# Patient Record
Sex: Male | Born: 1935 | Race: White | Hispanic: No | Marital: Married | Smoking: Former smoker
Health system: Southern US, Community
[De-identification: ages and names within clinical notes are randomized; demographics above are authoritative.]

## PROBLEM LIST (undated history)

## (undated) DIAGNOSIS — M316 Other giant cell arteritis: Secondary | ICD-10-CM

## (undated) DIAGNOSIS — K922 Gastrointestinal hemorrhage, unspecified: Secondary | ICD-10-CM

## (undated) DIAGNOSIS — I779 Disorder of arteries and arterioles, unspecified: Secondary | ICD-10-CM

## (undated) DIAGNOSIS — I2510 Atherosclerotic heart disease of native coronary artery without angina pectoris: Secondary | ICD-10-CM

## (undated) DIAGNOSIS — I4891 Unspecified atrial fibrillation: Secondary | ICD-10-CM

## (undated) DIAGNOSIS — I4892 Unspecified atrial flutter: Secondary | ICD-10-CM

## (undated) DIAGNOSIS — M1990 Unspecified osteoarthritis, unspecified site: Secondary | ICD-10-CM

## (undated) DIAGNOSIS — I739 Peripheral vascular disease, unspecified: Secondary | ICD-10-CM

## (undated) DIAGNOSIS — N289 Disorder of kidney and ureter, unspecified: Secondary | ICD-10-CM

## (undated) DIAGNOSIS — K219 Gastro-esophageal reflux disease without esophagitis: Secondary | ICD-10-CM

## (undated) HISTORY — PX: ROTATOR CUFF REPAIR: SHX139

## (undated) HISTORY — DX: Disorder of kidney and ureter, unspecified: N28.9

## (undated) HISTORY — DX: Unspecified atrial fibrillation: I48.91

## (undated) HISTORY — PX: OTHER SURGICAL HISTORY: SHX169

## (undated) HISTORY — DX: Other giant cell arteritis: M31.6

## (undated) HISTORY — DX: Peripheral vascular disease, unspecified: I73.9

## (undated) HISTORY — DX: Gastro-esophageal reflux disease without esophagitis: K21.9

## (undated) HISTORY — DX: Unspecified osteoarthritis, unspecified site: M19.90

## (undated) HISTORY — DX: Unspecified atrial flutter: I48.92

## (undated) HISTORY — DX: Gastrointestinal hemorrhage, unspecified: K92.2

## (undated) HISTORY — DX: Atherosclerotic heart disease of native coronary artery without angina pectoris: I25.10

## (undated) HISTORY — DX: Disorder of arteries and arterioles, unspecified: I77.9

## (undated) HISTORY — PX: SHOULDER ARTHROSCOPY: SHX128

---

## 1992-06-27 HISTORY — PX: OTHER SURGICAL HISTORY: SHX169

## 1997-06-27 HISTORY — PX: HERNIA REPAIR: SHX51

## 2000-06-27 HISTORY — PX: OTHER SURGICAL HISTORY: SHX169

## 2004-07-07 ENCOUNTER — Ambulatory Visit: Payer: Self-pay | Admitting: Cardiology

## 2005-08-23 ENCOUNTER — Ambulatory Visit: Payer: Self-pay | Admitting: Cardiology

## 2006-10-27 ENCOUNTER — Encounter: Payer: Self-pay | Admitting: Cardiology

## 2006-10-31 HISTORY — PX: OTHER SURGICAL HISTORY: SHX169

## 2007-02-23 ENCOUNTER — Encounter: Payer: Self-pay | Admitting: Cardiology

## 2007-10-04 ENCOUNTER — Ambulatory Visit: Payer: Self-pay | Admitting: Cardiology

## 2007-10-30 ENCOUNTER — Encounter: Payer: Self-pay | Admitting: Cardiology

## 2007-10-31 ENCOUNTER — Encounter: Payer: Self-pay | Admitting: Cardiology

## 2007-11-01 ENCOUNTER — Encounter: Payer: Self-pay | Admitting: Cardiology

## 2008-07-02 ENCOUNTER — Encounter: Payer: Self-pay | Admitting: Cardiology

## 2008-08-01 ENCOUNTER — Ambulatory Visit: Payer: Self-pay | Admitting: Internal Medicine

## 2008-08-07 ENCOUNTER — Encounter: Payer: Self-pay | Admitting: Cardiology

## 2008-08-11 ENCOUNTER — Ambulatory Visit: Payer: Self-pay | Admitting: Cardiology

## 2008-08-11 DIAGNOSIS — I4891 Unspecified atrial fibrillation: Secondary | ICD-10-CM | POA: Insufficient documentation

## 2008-08-11 DIAGNOSIS — I10 Essential (primary) hypertension: Secondary | ICD-10-CM

## 2008-08-12 ENCOUNTER — Inpatient Hospital Stay (HOSPITAL_COMMUNITY): Admission: RE | Admit: 2008-08-12 | Discharge: 2008-08-16 | Payer: Self-pay | Admitting: Orthopaedic Surgery

## 2009-02-19 ENCOUNTER — Encounter (INDEPENDENT_AMBULATORY_CARE_PROVIDER_SITE_OTHER): Payer: Self-pay | Admitting: *Deleted

## 2009-02-26 ENCOUNTER — Ambulatory Visit: Payer: Self-pay | Admitting: Cardiology

## 2009-02-26 DIAGNOSIS — R0989 Other specified symptoms and signs involving the circulatory and respiratory systems: Secondary | ICD-10-CM

## 2009-02-26 DIAGNOSIS — R0609 Other forms of dyspnea: Secondary | ICD-10-CM

## 2009-09-01 ENCOUNTER — Encounter: Payer: Self-pay | Admitting: Cardiology

## 2009-09-17 ENCOUNTER — Encounter: Payer: Self-pay | Admitting: Cardiology

## 2009-09-22 ENCOUNTER — Encounter: Payer: Self-pay | Admitting: Cardiology

## 2009-10-07 ENCOUNTER — Encounter (INDEPENDENT_AMBULATORY_CARE_PROVIDER_SITE_OTHER): Payer: Self-pay | Admitting: *Deleted

## 2009-10-07 ENCOUNTER — Ambulatory Visit: Payer: Self-pay | Admitting: Cardiology

## 2009-10-07 DIAGNOSIS — R42 Dizziness and giddiness: Secondary | ICD-10-CM

## 2009-10-13 ENCOUNTER — Encounter: Payer: Self-pay | Admitting: Cardiology

## 2009-10-13 ENCOUNTER — Ambulatory Visit: Payer: Self-pay | Admitting: Cardiology

## 2009-10-20 ENCOUNTER — Encounter: Payer: Self-pay | Admitting: Cardiology

## 2009-11-27 ENCOUNTER — Encounter: Payer: Self-pay | Admitting: Cardiology

## 2009-11-28 ENCOUNTER — Encounter: Payer: Self-pay | Admitting: Cardiology

## 2009-11-30 ENCOUNTER — Encounter: Payer: Self-pay | Admitting: Cardiology

## 2009-12-02 ENCOUNTER — Telehealth (INDEPENDENT_AMBULATORY_CARE_PROVIDER_SITE_OTHER): Payer: Self-pay | Admitting: *Deleted

## 2010-01-18 ENCOUNTER — Ambulatory Visit: Payer: Self-pay | Admitting: Cardiology

## 2010-05-05 ENCOUNTER — Encounter: Payer: Self-pay | Admitting: Cardiology

## 2010-05-17 ENCOUNTER — Encounter: Payer: Self-pay | Admitting: Cardiology

## 2010-06-09 ENCOUNTER — Encounter: Payer: Self-pay | Admitting: Cardiology

## 2010-06-23 ENCOUNTER — Encounter: Payer: Self-pay | Admitting: Cardiology

## 2010-07-02 ENCOUNTER — Ambulatory Visit
Admission: RE | Admit: 2010-07-02 | Discharge: 2010-07-02 | Payer: Self-pay | Source: Home / Self Care | Attending: Cardiology | Admitting: Cardiology

## 2010-07-02 ENCOUNTER — Encounter: Payer: Self-pay | Admitting: Cardiology

## 2010-07-18 ENCOUNTER — Encounter: Payer: Self-pay | Admitting: Orthopaedic Surgery

## 2010-07-27 NOTE — Letter (Signed)
Summary: Lexiscan or Dobutamine Pharmacist, community at [facility]  [address]   [place], [place] [address]   Phone: [phone]  Fax: [phone]      [facility] Cardiovascular Services  Lexiscan or Dobutamine Cardiolite Strss Test    [address] [place]  Appointment Date:_  Appointment Time:_  Your doctor has ordered a CARDIOLITE STRESS TEST using a medication to stimulate exercise so that you will not have to walk on the treadmill to determine the condition of your heart during stress. If you take blood pressure medication, ask your doctor if you should take it the day of your test. You should not have anything to eat or drink at least 4 hours before your test is scheduled, and no caffeine, including decaffeinated tea and coffee, chocolate, and soft drinks for 24 hours before your test.  You will need to register at the Outpatient/Main Entrance at the hospital 15 minutes before your appointment time. It is a good idea to bring a copy of your order with you. They will direct you to the Diagnostic Imaging (Radiology) Department.  You will be asked to undress from the waist up and given a hospital gown to wear, so dress comfortably from the waist down for example: Sweat pants, shorts, or skirt Rubber soled lace up shoes (tennis shoes)  Plan on about three hours from registration to release from the hospital

## 2010-07-27 NOTE — Letter (Signed)
Summary: Letter/ SUR[facility]  Letter/ SURG[facility]   Imported By: Dorise Hiss 10/26/2009 11:29:10  _____________________________________________________________________  External Attachment:    Type:   Image     Comment:   External Document

## 2010-07-27 NOTE — Letter (Signed)
Summary: [facility] D/C DR. Beatrix Fetters [facility]  [facility] D/C DR. [name]   Imported By: Zachary George 01/18/2010 09:03:41  _____________________________________________________________________  External Attachment:    Type:   Image     Comment:   External Document

## 2010-07-27 NOTE — Progress Notes (Signed)
Summary: Medications  Phone Note Call from Patient   Caller: Daughter Summary of Call: states that the [place] took patient off of Terazosin/tg Initial call taken by: Raechel Ache [facility],  December 02, 2009 1:11 PM  Follow-up for Phone Call        Per Dr. [name], this is an Jonita Albee pt. and should be handled by the [facility] staff.  Follow-up by: Larita Fife Via LPN,  December 03, 4694 1:23 PM  Additional Follow-up for Phone Call Additional follow up Details #1::        Combined with previous note regarding medications. Additional Follow-up by: Cyril Loosen, RN, BSN,  December 02, 2009 4:53 PM

## 2010-07-27 NOTE — Assessment & Plan Note (Signed)
Summary: 3 month fu -recv reminder vs   Visit Type:  Follow-up Primary Provider:  Dr. [name]   History of Present Illness: 77 year old male presents for followup. Patient was referred for followup ischemic testing following his last visit, reviewed below. This was done as part of a preoperative assessment, although ultimately he never underwent right shoulder surgery.  I note that he was admitted to [facility] back in June following a fall. He ruled out for myocardial infarction and was treated for left-sided rib pain. He states he did not have chest pain or syncope, and that he tripped. He did undergo some medication adjustments.   He tells me that Dr. Sherryll Burger added lisinopril, although he complained of being dizzy on this medication and having relatively low blood pressures, studies stopped it. He was also seen at the [facility] clinic and his prostate medications were adjusted with concerns about relatively low blood pressure.   More recent blood pressure checks at home show systolics ranging in the 110-140 range, with heart rates in the 70s to 80s. He states he feels much better.  Preventive Screening-Counseling & Management  Alcohol-Tobacco     Smoking Status: quit     Year Quit: 1997  Current Medications (verified): 1)  Verapamil Hcl Cr 240 Mg Xr24h-Cap (Verapamil Hcl) .... Take One Capsule By Mouth Daily 2)  Multivitamins   Tabs (Multiple Vitamin) .... Once Daily 3)  Nitroglycerin 0.4 Mg Subl (Nitroglycerin) .... One Tablet Under Tongue Every 5 Minutes As Needed For Chest Pain---May Repeat Times Three 4)  Tylenol Extra Strength 500 Mg Tabs (Acetaminophen) .... As Needed 5)  Omeprazole 20 Mg Cpdr (Omeprazole) .... Take 1 Tablet By Mouth Once A Day 6)  Tamsulosin Hcl 0.4 Mg Caps (Tamsulosin Hcl) .... Take 1 Tablet By Mouth Once A Day  Allergies (verified): 1)  ! Asa 2)  ! Coumadin 3)  ! Plavix (Clopidogrel Bisulfate)  Comments:  Nurse/Medical Assistant: The patient's  medication list and allergies were reviewed with the patient and were updated in the Medication and Allergy Lists.  Past History:  Past Medical History: Last updated: 08/11/2008 Arthritis Atrial Fibrillation G I Bleed  Social History: Last updated: 08/11/2008 Retired  Tobacco Use - No.  Alcohol Use - no  Review of Systems  The patient denies anorexia, chest pain, syncope, dyspnea on exertion, peripheral edema, melena, and hematochezia.         Otherwise reviewed and negative.  Vital Signs:  Patient profile:   77 year old male Height:      71 inches Weight:      178 pounds BMI:     24.92 O2 Sat:      98 % on Room air Pulse rate:   61 / minute BP sitting:   136 / 77  (left arm) Cuff size:   regular  Vitals Entered By: Carlye Grippe (January 18, 2010 11:03 AM)  O2 Flow:  Room air  Physical Exam  Additional Exam:  Overweight male in no acute distress. HEENT: Conjunctiva and lids normal, oropharynx with moist mucosa. Neck: Supple, no elevated JVP or bruits. Lungs: Clear auscultation, nonlabored. Abdomen: Soft, nontender. Extremity: No pitting edema, distal pulses 1-2+. Skin: Warm and dry. Musculoskeletal: No gross deformities. He does have some limited movement of his right shoulder. Neuropsychiatric: Alert and oriented x3, affect appropriate.   Nuclear Study  Procedure date:  10/13/2009  Findings:      [employer]. No diagnostic ST segment changes. LVEF 62%. Medium, partially reversible, mid  to basal inferior defect suggesting ischemia, however in the setting of subdiaphragmatic uptake at rest.  Impression & Recommendations:  Problem # 1:  DIZZINESS (ICD-780.4)  This has improved significantly, possibly related to relative hypotension. He has undergone medication adjustments, now reflected in his list above. No changes were made today. I asked him to stay off the lisinopril.  Problem # 2:  ESSENTIAL HYPERTENSION, BENIGN (ICD-401.1)  Patient will  continue to check blood pressure at home. Most recent measurements are reasonable.  The following medications were removed from the medication list:    Terazosin Hcl 5 Mg Caps (Terazosin hcl) .Marland Kitchen... 2 at bedtime    Lisinopril 10 Mg Tabs (Lisinopril) .Marland Kitchen... Take 1 tablet by mouth once daily His updated medication list for this problem includes:    Verapamil Hcl Cr 240 Mg Xr24h-cap (Verapamil hcl) .Marland Kitchen... Take one capsule by mouth daily  Problem # 3:  ATRIAL FIBRILLATION (ICD-427.31)  Well-controlled symptomatically at this time on verapamil. Unable to use aspirin, Coumadin or Plavix related to previous adverse reactions and gastrointestinal hemorrhage.  Patient Instructions: 1)  Your physician wants you to follow-up in: 6 months. You will receive a reminder letter in the mail one-two months in advance. If you don't receive a letter, please call our office to schedule the follow-up appointment. 2)  Your physician recommends that you continue on your current medications as directed. Please refer to the Current Medication list given to you today.

## 2010-07-27 NOTE — Procedures (Signed)
Summary: Holter and Event/ [facility]  Holter and Event/ [facility]   Imported By: Dorise Hiss 10/23/2009 11:42:14  _____________________________________________________________________  External Attachment:    Type:   Image     Comment:   External Document  Appended Document: Holter and Event/ [facility] Reviewed.  No clear atrial fibrillation.

## 2010-07-27 NOTE — Letter (Signed)
Summary: External Correspondence/ OFFICE VISIT [facility]  External Correspondence/ OFFICE VISIT [facility]   Imported By: Dorise Hiss 10/23/2009 11:23:07  _____________________________________________________________________  External Attachment:    Type:   Image     Comment:   External Document

## 2010-07-27 NOTE — Letter (Signed)
Summary: External Correspondence/ OFFICE VISIT [facility]  External Correspondence/ OFFICE VISIT [facility]   Imported By: Dorise Hiss 10/23/2009 11:21:36  _____________________________________________________________________  External Attachment:    Type:   Image     Comment:   External Document

## 2010-07-27 NOTE — Progress Notes (Signed)
Summary: checking on new meds he was put on/tmj  Phone Note Call from Patient Call back at [phone]   Caller: PT Reason for Call: Talk to Nurse Summary of Call:  last time pt was seen by Dr [name] he was told he would have to be put on meds if her continued to have palpitations. Dr Clelia Croft has givin him a rx for Lisinopril 10 mg is this okay with Dr [name] for him take. Initial call taken by: Faythe Ghee,  December 02, 2009 9:13 AM  Follow-up for Phone Call        Pt. wants to be sure that Dr. [name] is aware that his PCP added Lisinoprl 10mg  by mouth once daily to his medications and wants to know if he agrees.  Follow-up by: Larita Fife Via LPN,  December 03, 979 9:58 AM  Additional Follow-up for Phone Call Additional follow up Details #1::        Lisinopril was likely added for HTN, not atrial fibrillation.  This is an [place] patient.  Please send this to Boneta Lucks to update record..  Further correspondence with patient should occur through the [place] office. Additional Follow-up by: Loreli Slot, MD, [facility],  December 02, 2009 1:15 PM    Additional Follow-up for Phone Call Additional follow up Details #2::    Caller: Daughter Summary of Call: states that the [place] took patient off of Terazosin/tg Initial call taken by: Raechel Ache [facility],  December 02, 2009 1:11 PM Follow-up for Phone Call         Per Dr. [name], this is an Jonita Albee pt. and should be handled by the [facility] staff.  Follow-up by: Larita Fife Via LPN,  December 02, 1912 1:23 PM Attempted to reach pt at number given in this message, but mailbox full, unable to leave message. Called home number in EMR and left message to call. Cyril Loosen, RN, BSN  December 02, 2009 4:52 PM  Spoke with pt who states Dr. Sherryll Burger prescribed Lisinopril 10mg  once daily and wants to know if this is okay for him to take. [facility] due to dizzy spells and low BP. This was replaced with tamsulosim b/c this would not effect his BP. Pt thinks he was placed  on Lisinopril for his heart being out of rhythm. He states this was started at [facility]. Records from [facility] will be pulled for review. Pt is aware Dr. [name] will review records from [facility] and this note and we will notify him further once this is reviewed. Follow-up by: Cyril Loosen, RN, BSN,  December 03, 2009 1:55 PM  Additional Follow-up for Phone Call Additional follow up Details #3:: Details for Additional Follow-up Action Taken: Pull records.  Lisinopril would not be used for atrial fibrillation. Additional Follow-up by: Loreli Slot, MD, [facility],  December 03, 2009 2:19 PM  New/Updated Medications: LISINOPRIL 10 MG TABS (LISINOPRIL) take 1 tablet by mouth once daily   Records from [facility] scanned into EMR. No d/c summary available at this time.  Cyril Loosen, RN, BSN  December 04, 2009 9:32 AM   Appended Document: checking on new meds he was put on/tmj Attempted to reach pt. No answer, mailbox has not been set up yet.  Appended Document: checking on new meds he was put on/tmj Left message to call back on machine.  Appended Document: checking on new meds he was put on/tmj Left messages for both the pt and  his dgt, [place], to call.  Appended Document: checking on new meds he was put on/tmj Pt's dgt notified to take meds as prescribed at D/C

## 2010-07-27 NOTE — Letter (Signed)
Summary: External Correspondence/ DR. Julian Reil  External Correspondence/ DR. Julian Reil   Imported By: Dorise Hiss 05/17/2010 15:57:05  _____________________________________________________________________  External Attachment:    Type:   Image     Comment:   External Document

## 2010-07-27 NOTE — Assessment & Plan Note (Signed)
Summary: 6 MO FU PER APRIL REMINDER-SRS   Preventive Screening-Counseling & Management  Alcohol-Tobacco     Smoking Status: quit     Year Quit: 1998  Current Medications (verified): 1)  Verapamil Hcl Cr 240 Mg Xr24h-Cap (Verapamil Hcl) .... Take One Capsule By Mouth Daily 2)  Multivitamins   Tabs (Multiple Vitamin) .... Once Daily 3)  Nitroglycerin 0.4 Mg Subl (Nitroglycerin) .... One Tablet Under Tongue Every 5 Minutes As Needed For Chest Pain---May Repeat Times Three 4)  Terazosin Hcl 5 Mg Caps (Terazosin Hcl) .... 2 At Bedtime 5)  Allegra 180 Mg Tabs (Fexofenadine Hcl) .... As Needed 6)  Tylenol Extra Strength 500 Mg Tabs (Acetaminophen) .... As Needed  Allergies (verified): 1)  ! Asa 2)  ! Coumadin 3)  ! Plavix (Clopidogrel Bisulfate)  Comments:  Nurse/Medical Assistant: The patient is currently on medications but does not know the name or dosage at this time. Instructed to contact our office with details. Will update medication list at that time. Patient stated all meds same since last visit.  Past History:  Past Medical History: Last updated: 08/11/2008 Arthritis Atrial Fibrillation G I Bleed  Social History: Last updated: 08/11/2008 Retired  Tobacco Use - No.  Alcohol Use - no  Visit Type:  Follow-up Primary Provider:  Dr. [name]   History of Present Illness: 77 year old male presents for a followup visit. He states he has been bothered by more frequent episodes of presumed recurrent atrial fibrillation. Symptomatically he feels dizzy and weak, sometimes with palpitations, although it is not clear that all these episodes are related to atrial fibrillation. He states that he had an episode like this while in his dermatologist's office, was sent to see Dr. Sherryll Burger the next day, and wore a Holter monitor for 24 hours. I do not have these results in hand, but the patient tells me that he only was found to have some occasional "skipped beats." He was also referred  for an echocardiogram done through [facility] internal medicine and revealed normal LVEF with LVH, mild left atrial enlargement, mild aortic valve sclerosis, and no significant mitral regurgitation. This episode occurred a few weeks ago, and he has had no recurrence. He is in normal sinus rhythm today.  In addition Mr. Brickle describes somewhat more noticeable shortness of breath with activity and occasional chest discomfort. His last ischemic evaluation was in 2003. He is also being considered for a right shoulder hemiarthroplasty under general anesthesia.     Physical Exam  Additional Exam:  Overweight male in no acute distress. HEENT: Conjunctiva and lids normal, oropharynx with moist mucosa. Neck: Supple, no elevated JVP or bruits. Lungs: Clear auscultation, nonlabored. Abdomen: Soft, nontender. Extremity: No pitting edema, distal pulses 1-2+. Skin: Warm and dry. Musculoskeletal: No gross deformities. He does have some limited movement of his right shoulder. Neuropsychiatric: Alert and oriented x3, affect appropriate.   Clinical Review Panels:  Nuclear Stress Testing Nuclear Stress Test Findings [place] [facility]:  No diagnostic ST segment changes during adenosine infusion. Rare premature ventricular complexes noted. Fixed apical defect as well as fixed inferior basal defect. Left ventricular ejection fraction of 60%. No clear evidence of ischemia. (10/03/2001)    Social History: Smoking Status:  quit  Review of Systems  The patient denies anorexia, fever, syncope, peripheral edema, prolonged cough, headaches, hemoptysis, melena, hematochezia, and severe indigestion/heartburn.         Otherwise reviewed and negative except as outlined.  Vital Signs:  Patient profile:   77 year old  male Pulse rate:   71 / minute BP sitting:   110 / 58  (left arm) Cuff size:   large     Impression & Recommendations:  Problem # 1:  ATRIAL FIBRILLATION (ICD-427.31)  Possibly with more  frequent episodes of recurrence, every 6-8 weeks. He has been experiencing episodic dizziness and weakness. I will request the results of the Holter monitor done through [facility] internal medicine recently. Otherwise reluctant to change any of his medications at this time without more objective data. His LVEF remains normal. He is on no anticoagulant or antiplatelet drugs and history of significant gastrointestinal hemorrhage. Can always consider potential antiarrhythmic therapy, although he will need follow up ischemic testing, particularly as he may be undergoing right shoulder surgery under general anesthesia. Is not clear that his recurrent atrial fibrillation would however preclude him proceeding with surgery, presuming his stress testing is low risk. Three-month visit will be scheduled, sooner if stress testing is significantly abnormal.  Orders: EKG w/ Interpretation (93000) Nuclear Med (Nuc Med)  Problem # 2:  DYSPNEA ON EXERTION (ICD-786.09)  As noted above, follow up ischemic testing will be obtained. We will order a Lexiscan Cardiolite on medical therapy.  His updated medication list for this problem includes:    Verapamil Hcl Cr 240 Mg Xr24h-cap (Verapamil hcl) .Marland Kitchen... Take one capsule by mouth daily   Patient Instructions: 1)  Lexiscan stress test 2)  Follow up in  3 months     EKG  Procedure date:  10/07/2009  Findings:      Sinus rhythm with prolonged PR interval of 270 ms, otherwise normal.  Echocardiogram  Procedure date:  09/22/2009  Findings:      [facility] internal medicine study:  Mild LVH with LVEF 70%, mild left atrial enlargement, normal aortic root, trace mitral and tricuspid regurgitation, mild aortic valve sclerosis, no pericardial effusion.

## 2010-07-29 NOTE — Assessment & Plan Note (Signed)
Summary: 6 MO FUL   Visit Type:  Follow-up Primary Provider:  Dr. [name]   History of Present Illness: 77 year old male presents for followup. He was seen back in July. Records indicate that he was seen by Dr. Julian Reil back in November following sudden vision loss in the left eye. He tells me that he was ruled out for having had a stroke, and ultimately diagnosed with temporal arteritis following a biopsy. He has been on prednisone since that time, and has recovered some vision.  No report of palpitations or unusual breathlessness. He has been compliant with his medications by report.  Preventive Screening-Counseling & Management  Alcohol-Tobacco     Smoking Status: quit     Year Quit: 1997  Current Medications (verified): 1)  Verapamil Hcl Cr 240 Mg Xr24h-Cap (Verapamil Hcl) .... Take One Capsule By Mouth Daily 2)  Multivitamins   Tabs (Multiple Vitamin) .... Once Daily 3)  Nitroglycerin 0.4 Mg Subl (Nitroglycerin) .... One Tablet Under Tongue Every 5 Minutes As Needed For Chest Pain---May Repeat Times Three 4)  Tylenol Extra Strength 500 Mg Tabs (Acetaminophen) .... As Needed 5)  Omeprazole 20 Mg Cpdr (Omeprazole) .... Take 1 Tablet By Mouth Once A Day 6)  Tamsulosin Hcl 0.4 Mg Caps (Tamsulosin Hcl) .... Take 1 Tablet By Mouth Once A Day 7)  Prednisone 10 Mg Tabs (Prednisone) .... Use As Directed  Allergies (verified): 1)  ! Asa 2)  ! Coumadin 3)  ! Plavix (Clopidogrel Bisulfate)  Comments:  Nurse/Medical Assistant: The patient's medication list and allergies were reviewed with the patient and were updated in the Medication and Allergy Lists.  Past History:  Social History: Last updated: 08/11/2008 Retired  Tobacco Use - No.  Alcohol Use - no  Past Medical History: Arthritis Atrial Fibrillation G I Bleed Temporal arteritis  Review of Systems  The patient denies anorexia, fever, weight loss, chest pain, syncope, dyspnea on exertion, peripheral edema, melena,  and hematochezia.         Otherwise reviewed and negative except as outlined.  Vital Signs:  Patient profile:   77 year old male Height:      71 inches Weight:      178 pounds Pulse rate:   72 / minute BP sitting:   146 / 78  (left arm) Cuff size:   regular  Vitals Entered By: Carlye Grippe (July 02, 2010 2:47 PM)  Physical Exam  Additional Exam:  Overweight male in no acute distress. HEENT: Conjunctiva and lids normal, oropharynx with moist mucosa. Neck: Supple, no elevated JVP or bruits. Lungs: Clear auscultation, nonlabored. Cardiac: Irregularly irregular, no S3. Abdomen: Soft, nontender. Extremity: No pitting edema, distal pulses 1-2+. Skin: Warm and dry. Musculoskeletal: No gross deformities. He does have some limited movement of his right shoulder. Neuropsychiatric: Alert and oriented x3, affect appropriate.   Carotid Doppler  Procedure date:  05/17/2010  Findings:      1-50% stenosis bilaterally within the internal carotid arteries.  EKG  Procedure date:  07/02/2010  Findings:      Atrial fibrillation at 77 beats per minute.  Impression & Recommendations:  Problem # 1:  ATRIAL FIBRILLATION (ICD-427.31)   Symptomatically stable on medical therapy. Continue present course. We have not been able to use antiplatelet or anticoagulant medications related to prior gastrointestinal hemorrhage. I will see him back in 6 months.  Orders: EKG w/ Interpretation (93000)  Patient Instructions: 1)  Your physician recommends that you schedule a follow-up appointment in: 6 months  2)  Your physician recommends that you continue on your current medications as directed. Please refer to the Current Medication list given to you today.

## 2010-10-12 LAB — HEMOGLOBIN AND HEMATOCRIT, BLOOD
HCT: 26.2 % — ABNORMAL LOW (ref 39.0–52.0)
HCT: 31.9 % — ABNORMAL LOW (ref 39.0–52.0)
Hemoglobin: 10.9 g/dL — ABNORMAL LOW (ref 13.0–17.0)
Hemoglobin: 8.8 g/dL — ABNORMAL LOW (ref 13.0–17.0)

## 2010-10-12 LAB — CBC
HCT: 27.1 % — ABNORMAL LOW (ref 39.0–52.0)
Hemoglobin: 10.4 g/dL — ABNORMAL LOW (ref 13.0–17.0)
MCV: 82.3 fL (ref 78.0–100.0)
Platelets: 246 10*3/uL (ref 150–400)
Platelets: 263 10*3/uL (ref 150–400)
Platelets: 359 10*3/uL (ref 150–400)
RBC: 3.12 MIL/uL — ABNORMAL LOW (ref 4.22–5.81)
RBC: 3.75 MIL/uL — ABNORMAL LOW (ref 4.22–5.81)
RDW: 16.3 % — ABNORMAL HIGH (ref 11.5–15.5)
RDW: 16.8 % — ABNORMAL HIGH (ref 11.5–15.5)
WBC: 12.9 10*3/uL — ABNORMAL HIGH (ref 4.0–10.5)
WBC: 7.6 10*3/uL (ref 4.0–10.5)
WBC: 7.9 10*3/uL (ref 4.0–10.5)

## 2010-10-12 LAB — CROSSMATCH: Antibody Screen: NEGATIVE

## 2010-10-12 LAB — URINALYSIS, ROUTINE W REFLEX MICROSCOPIC
Glucose, UA: NEGATIVE mg/dL
Glucose, UA: NEGATIVE mg/dL
Leukocytes, UA: NEGATIVE
Leukocytes, UA: NEGATIVE
Nitrite: NEGATIVE
Protein, ur: NEGATIVE mg/dL
Specific Gravity, Urine: 1.016 (ref 1.005–1.030)
Specific Gravity, Urine: 1.03 (ref 1.005–1.030)
Urobilinogen, UA: 0.2 mg/dL (ref 0.0–1.0)
pH: 5.5 (ref 5.0–8.0)

## 2010-10-12 LAB — DIFFERENTIAL
Basophils Relative: 0 % (ref 0–1)
Eosinophils Relative: 0 % (ref 0–5)
Monocytes Absolute: 1.2 10*3/uL — ABNORMAL HIGH (ref 0.1–1.0)
Monocytes Relative: 9 % (ref 3–12)
Neutro Abs: 10.1 10*3/uL — ABNORMAL HIGH (ref 1.7–7.7)

## 2010-10-12 LAB — COMPREHENSIVE METABOLIC PANEL
AST: 16 U/L (ref 0–37)
Albumin: 3.3 g/dL — ABNORMAL LOW (ref 3.5–5.2)
Alkaline Phosphatase: 59 U/L (ref 39–117)
BUN: 28 mg/dL — ABNORMAL HIGH (ref 6–23)
Chloride: 112 mEq/L (ref 96–112)
GFR calc Af Amer: >60 mL/min (ref >60)
Potassium: 4.1 mEq/L (ref 3.5–5.1)
Sodium: 140 mEq/L (ref 135–145)
Total Protein: 6.7 g/dL (ref 6.0–8.3)

## 2010-10-12 LAB — URINE MICROSCOPIC-ADD ON

## 2010-10-12 LAB — PREPARE RBC (CROSSMATCH)

## 2010-10-12 LAB — BASIC METABOLIC PANEL
BUN: 19 mg/dL (ref 6–23)
Calcium: 8.6 mg/dL (ref 8.4–10.5)
Chloride: 107 mEq/L (ref 96–112)
Creatinine, Ser: 1.11 mg/dL (ref 0.4–1.5)
Creatinine, Ser: 1.52 mg/dL — ABNORMAL HIGH (ref 0.4–1.5)
GFR calc Af Amer: 54 mL/min — ABNORMAL LOW (ref >60)
GFR calc Af Amer: >60 mL/min (ref >60)
GFR calc non Af Amer: 44 mL/min — ABNORMAL LOW (ref >60)
GFR calc non Af Amer: >60 mL/min (ref >60)

## 2010-10-12 LAB — APTT: aPTT: 35 seconds (ref 24–37)

## 2010-10-12 LAB — ABO/RH: ABO/RH(D): A POS

## 2010-10-12 LAB — URINE CULTURE

## 2010-11-09 NOTE — Consult Note (Signed)
Glenn Thornton, COCUZZA NO.:  [id number]   MEDICAL RECORD NO.:  [id number]          PATIENT TYPE:  INP   LOCATION:  4713                         FACILITY:  [facility]   PHYSICIAN:  Bevelyn Buckles. Bensimhon, MDDATE OF BIRTH:  11-05-1925   DATE OF CONSULTATION:  08/12/2008  DATE OF DISCHARGE:                                 CONSULTATION   PRIMARY CARDIOLOGIST:  Jonelle Sidle, MD, in our [place] office.   PRIMARY CARE [name]:  [name], MD   PATIENT PROFILE:  An 77 year old married Caucasian male with history of  paroxysmal atrial fibrillation, who presented today for left shoulder  hemiarthroplasty, who we were asked to evaluate.   PROBLEMS:  1. Paroxysmal atrial fibrillation.      a.     In 2008 2-D echocardiogram, EF 50-60% with aortic sclerosis.  2. Osteoarthritis of the bilateral shoulders.      a.     Status post surgery in the past bilaterally.      b.     Status post left hemiarthroplasty today.  3. Hypertension.  4. GERD.  5. Seasonal allergies.  6. History of GI bleeding when previously on Plavix as well as      Coumadin.   HISTORY OF PRESENT ILLNESS:  An 77 year old married Caucasian male with  history of PAF followed by Dr. [name] and managed with verapamil CR.  The patient has developed hives with aspirin in the past.  He has also  had GI bleeding when on Plavix and Coumadin (separately) in the past.  His CHADS2 score is 2 with a history of hypertension and age greater  than 90.  The patient also has a history of bilateral shoulder pain and  has had undergone surgeries in the past.  He has been having left  shoulder discomfort more recently and has been evaluated by Dr.  [name] with recommendation for left hemiarthroplasty.  Subsequently,  he saw Dr. [name] last week and he was felt to be at low risk for  surgery without any additional preoperative testing.  Surgery was  performed today and was uneventful.  The patient currently denies any  chest pain, palpitations, or dyspnea.  He was anemic postoperatively  with his H and H dropping at 8.8 and 26.2 from 10.2 and 30.1  respectively.  He remained in sinus rhythm and we were asked to evaluate  and follow throughout hospitalization.   ALLERGIES:  1. ASPIRIN causes hives.  2. COUMADIN causes GI bleed.  3. PLAVIX causes GI bleed.   HOME MEDICATIONS:  1. Verapamil SR 240 mg daily.  2. Multivitamin daily.  3. Prilosec 20 mg daily.  4. Terazosin 5 mg 2 tabs nightly.  5. Ferrous sulfate daily.  6. Allegra 180 mg p.r.n.   FAMILY HISTORY:  Mother died at 34 of diabetes and MI.  Father died at  83 of cerebral aneurysm.  He has 4 brothers, 2 sisters.  One brother  died of lung cancer, another brother died of diabetes and MI.  His other  2 brothers and 2 sisters are alive and well.   SOCIAL  HISTORY:  He lives in [place], [place], with his wife, who he  helps take care of; she has a history of COPD, MI, and obesity.  He is a  retired Medical illustrator.  He has a 50 pack-a-year history of tobacco use,  quitting about 15 years ago.  He used to occasionally have an alcoholic  beverage, but has not had anything in 15 years.  He denies any drug use.  He is not very active other than taking care of his wife.   REVIEW OF SYSTEMS:  Positive for left shoulder pain.  Otherwise, all  systems reviewed and are negative.  He is a full code.   PHYSICAL EXAMINATION:  VITAL SIGNS:  Temperature 97.8, heart rate 68,  respirations 18, blood pressure 127/68, and pulse ox 100% on room air.  GENERAL:  Pleasant white male in no acute distress, awake, alert, and  oriented x3.  HEENT:  Normal.  Nares grossly intact, nonfocal.  SKIN:  Warm and dry without lesions or masses.  PSYCH:  Normal affect.  MUSCULOSKELETAL:  He has a dressing to his left shoulder which is dry  and intact.  He does have some swelling over the left shoulder and he is  tender.  NECK:  No bruits or JVD.  LUNGS:  Respirations are  unlabored.  CARDIAC:  Regular S1 and S2.  No S3 or S4.  He has a soft systolic  murmur at the apex.  ABDOMEN:  Round, soft, nontender, and nondistended.  Bowel sounds  present x4.  EXTREMITIES:  Warm, dry, and pink.  No clubbing, cyanosis, or edema.  Dorsalis pedis, posterior tibial pulses are 2+ and equal bilaterally.   Chest x-ray from this morning shows COPD and emphysema.  EKG shows sinus  rhythm at a rate 87, normal axis, first-degree AV block, no acute ST-T  changes.  Hemoglobin is 8.8 and hematocrit 26.2, WBC 12.9, platelets  359.  Sodium 140, potassium 4.1, chloride 112, CO2 18, BUN 28,  creatinine 1.33, glucose 131, total bilirubin 0.5, alkaline phosphatase  59, AST 16, ALT 12, total protein 6.7, and albumin 3.3.  INR 1.0.  Calcium 9.0.  Urinalysis negative.   ASSESSMENT AND PLAN:  1. Paroxysmal atrial fibrillation.  The patient is currently in sinus      rhythm and we recommend continuation of his      long-acting verapamil.  He was not previously on anticoagulation      secondary to problems with anticoagulation in the past as outlined      above.  We will plan to follow along and I recommend calling us      with any acute issues.  2. Anemia, per Orthopedics.      Nicolasa Ducking, ANP      Bevelyn Buckles. Bensimhon, MD  Electronically Signed    CB/MEDQ  D:  08/12/2008  T:  08/13/2008  Job:  [id number]

## 2010-11-09 NOTE — Discharge Summary (Signed)
NAMEDIONISIOS, RICCI NO.:  [id number]   MEDICAL RECORD NO.:  [id number]          PATIENT TYPE:  INP   LOCATION:  4713                         FACILITY:  [facility]   PHYSICIAN:  Claude Manges. Whitfield, M.D.DATE OF BIRTH:  Dec 07, 1925   DATE OF ADMISSION:  08/12/2008  DATE OF DISCHARGE:  08/15/2008                               DISCHARGE SUMMARY   ADMISSION DIAGNOSIS:  Osteoarthritis left shoulder with rotator cuff  arthropathy.   DISCHARGE DIAGNOSES:  1. Osteoarthritis left shoulder with rotator cuff arthropathy.  2. Osteoarthritis right shoulder.  3. History of hypertension.  4. History of emphysema.  5. History prostate carcinoma.  6. History of myocardial infarction.  7. Chronic renal insufficiency.  8. Chronic anemia.  9. Urinary retention.  10.Acute blood loss anemia.  11.Second-degree heart block with history of paroxysmal atrial      fibrillation.  12.Gastroesophageal reflux disease.   PROCEDURE:  Left CTA of the shoulder.   HISTORY:  Mr. Ramus is an 77 year old white married male with constant  severe throbbing and aching pain of both shoulders, left greater than  right.  He is now having marked decreased range of motion and inability  to perform his activities of daily living.  He has failed conservative  treatment for this problem.  He has had apparently rotator cuff tear  with an arthropathy.  He is indicated now for left shoulder  hemiarthroplasty.   HOSPITAL COURSE:  An 77 year old male admitted August 12, 2008, and  after appropriate laboratory studies were obtained, he was then taken to  the operating room where he underwent a left CTA hemiarthroplasty of his  shoulder.  He tolerated the procedure well.  He was placed on a custom  Dilaudid PCA pump of loading dose of 0.2, dosing of 0.2, lock out 10  minutes and 5 mg 4-hour limit.  Given hydrocodone for postoperative  pain.  Consultations with OT and care management were made.  The patient  wanted to [facility] at time of discharge.  He was  placed on telemetry floor, and a consultation with [facility]  was ordered.  He was given 1 unit of packed cells on the 16th.  He was  allowed out of bed to chair the following day.  PT for gentle range of  motion, and circumduction exercise was ordered.  Verapamil SR 240 mg  p.o. daily was started on the 17th.  Cardiology p.r.n. orders for the  chart.  Also was started back on Hytrin 5 mg 2 p.o. every night.  The  patient was also given a GI cocktail on the 17th and was given  omeprazole 20 mg p.o. q.a.m.  His Foley had been discontinued, but  because of urinary retention, he was attempted to have a straight  catheter which was unable to be obtained, and a urology consult was  obtained, and a Foley was placed, and he was to keep this for 4 days.  On the 18th, he was also noted to be in a second degree heart block, and  cardiology was called.  No new orders  were given.  On the 19th, he was  given 1 unit of packed cells over 2 to 3 hours.  Cardiology was called  to evaluate the bradycardic episode, and at that time, they will  consider plans for discharge.   LABORATORY STUDIES:  Admitted with a hemoglobin of 10.2, hematocrit  30.1%, white count 12,900, platelets were 359,000.  On the 18th,  hemoglobin was 8.6, hematocrit 25.8%, white count 7600, platelets were  246,000.  Preoperative sodium was 140, potassium 4.1, chloride 112, CO2  18, glucose 131, BUN 28, creatinine 1.33.  GFR was 52.  Bilirubin 0.5,  alkaline phosphatase 59, SGOT 16, SGPT 12, total protein 6.7, albumin  3.3, and calcium 9.1.  On the 18th, sodium was 135, potassium 4.1,  chloride 107, CO2 22, glucose 132, BUN 19, creatinine 1.52.  GFR was 44,  and calcium was 8.3.  Urinalysis on the 16th revealed few squamous, rare  bacteria, and mucus was present.  Urine culture of February 16 revealed  no growth.  Urine from February 17 revealed moderate  amount of blood,  rare squamous, 0 to 2 whites, 11-20 reds, rare bacteria.   RADIOGRAPHIC STUDIES:  Chest x-ray February 16 revealed COPD and  emphysema.  EKG on August 11, 2008, revealed sinus rhythm with first-  degree AV block.  EKG of August 12, 2008, showed atrial fibrillation.  EKG of August 14, 2008, at 1800 hours revealed sinus rhythm with  second degree AV block, Mobitz type 1, septal infarct, age undetermined.  Heart rate at that time was 52.   DISCHARGE INSTRUCTIONS:  He is discharged on a heart healthy diet.  Keep  the incision clean and dry and do not remove the Steri-Strips.  Call if  any signs of infection.  He is to use a sling except for his exercises  which are circumduction with gentle range of motion.  He may shower, but  if possible, place saran wrap over the incision.  No lifting or driving  for 6 weeks.  He is to follow up with Dr. [name] on August 25, 2008.   MEDICATIONS:  Include:  1. Norco 5/325 one to two tablets q.4 h. p.r.n. pain.  2. Colace 100 mg b.i.d., hold for diarrhea.  3. Prilosec 20 mg at 12 p.m.  4. Verapamil 240 mg SR at 10:00 a.m.  5. Hytrin 10 mg h.s.  6. Allegra 180 mg daily p.r.n., then multivitamin daily.   Once he is cleared by cardiology, he may be discharged to [facility].  He was discharged in improved condition.  In addition, the Foley  which was placed will need to be removed on August 18, 2008, as per  urology orders.   </RADIOGRAPHIC      Oris Drone Petrarca, P.A.-C.      Claude Manges. [name], M.D.  Electronically Signed    BDP/MEDQ  D:  08/15/2008  T:  08/15/2008  Job:  [id number]

## 2010-11-09 NOTE — Op Note (Signed)
Glenn Thornton, Glenn Thornton NO.:  [id number]   MEDICAL RECORD NO.:  [id number]          PATIENT TYPE:  INP   LOCATION:  4713                         FACILITY:  [facility]   PHYSICIAN:  Claude Manges. Whitfield, M.D.DATE OF BIRTH:  July 12, 1925   DATE OF PROCEDURE:  08/12/2008  DATE OF DISCHARGE:                               OPERATIVE REPORT   PREOPERATIVE DIAGNOSIS:  Rotator cuff tear arthropathy, left shoulder.   POSTOPERATIVE DIAGNOSIS:  Rotator cuff tear arthropathy, left shoulder.   PROCEDURE:  cuff tear arthropathy and hemiarthroplasty, left shoulder  surgery.   SURGEON:  Claude Manges. [name], MD   ASSISTANT:  Lenard Galloway. Chaney Malling, MD   ANESTHESIA:  General.   COMPLICATIONS:  None.   COMPONENTS:  DePuy Global Advantage cuff tear arthropathy head, 48-mm  outer diameter with a 23 mm neck length.  A Global Advantage 14-mm  humeral stem, the stem was Press-Fit.   PROCEDURE:  With the patient comfortable on the operating table, the  patient was placed under general orotracheal anesthesia.  Nursing staff  inserted a Foley catheter, required a Coude catheter.  Urine was clear.   The patient was then placed in the semi-sitting position with the  shoulder frame.  The left shoulder was prepped with DuraPrep from the  base of the neck circumferentially to below the elbow.  Sterile draping  was performed.   A marking pen was used to outline a deltopectoral skin incision  beginning at the level of the coracoid, extending distally about 3  inches.  Via sharp dissection, incision was carried down to the  subcutaneous tissue.  Small bleeders were Bovie coagulated.  The  deltopectoral groove was identified.  It was carefully dissected  bluntly.  The cephalic vein was identified and retracted laterally.  Self-retaining retractor was inserted.  The clavipectoral fascia was  identified and was incised.  The conjoined tendon was taken medially and  the fascia was incised to the level of  the subscapularis.  The patient  did not have a rotational deformity preoperatively, but did have  limitation of abduction and flexion.   The subscapularis tendon was identified.  It was incised about a  centimeter from its attachment to the lesser tuberosity and tagged  superiorly and inferiorly.  It was taken as one layer with the capsule  and the subscap.  The joint was then entered.  There was abundant  synovial fluid that was removed.  There was abundant synovitis.  A  synovectomy and capsulectomy was then performed.  The humeral head  revealed multiple osteophytes posteriorly, inferiorly, and anteriorly.  The head was devoid of articular cartilage.  The glenoid appeared to be  its normal shape, but there was considerable loss of articular  cartilage.  Capsule did not appear to be particularly tight, but I did  release as best I could manually from circumferentially from the glenoid  with better visualization.  No further synovectomy was performed.   I could then easily deliver the head into the wound, but drill hole was  then made at the very center portion and reaming was performed  to 14 mm  where I had good endosteal purchase.  The medullary canal cutting guide  was then applied to the 14-mm reamer and with the appropriate  retroversion angle, osteotomy was performed with the head.  The cutting  jig and the reamer were then removed and rasping was performed to 14 mm.  We trialed a head and then reduced it and we thought we had perfect  position of the retroversion cut.   The trial head was then removed.  I elected to perform a CTA head  arthroplasty because the patient had a significantly thinned rotator  cuff and the CTA cutting guide was then applied and then further cutting  of the head superiorly was performed.  We did inspect the biceps tendon  and it was not in good position.  There was some fraying, but we left it  in place without need for a biceps tenodesis.   At  that point, the CTA head cutting jig was removed.  The trial CTA head  was applied.  We initially used a 48 mm outer diameter with a 15-mm neck  length.  It was then reduced and I felt we had a little bit too much  place, so we then inserted the neck length 23 mm.  At that point, we had  about 50% of posterior subluxation and I did not have the inferior  subluxation.  I could abduct probably 120 degrees where as  preoperatively was about 70-75 and I could flex about 130 degrees as was  preoperatively, it was fairly 90-100 degrees.  The trial device was  removed.  The joint was copiously irrigated with saline solution.  I  again inspected the joint and any further synovitis was then removed.  Further irrigation was performed with saline solution.   The final 14-mm Global Advantage humeral stem was then impacted.  We  cleaned the reverse Morse taper neck and then inserted the 48-mm outer  diameter with 23-mm neck length CTA head.  I impacted it, checked it,  and it was nice and tight.  I again irrigated the joint and reduced the  entire construct.  I thought had a very nice stability and excellent  position of the head.   The joint was again irrigated with saline solution.   The subscapularis was then reattached anatomically with the #1 Ethilon.  We had nice closure.  The cuff was thin as previously mentioned.  I did  repair any obvious tears.   The deltopectoral groove interval was closed with running 0 Vicryl.  Cephalic vein remained intact.  Subcu was closed with 2-0 Vicryl.  Skin  was closed with skin clips.  Xylocaine without epinephrine was injected  into the wound edges.  Sterile bulky dressing was applied followed by a  sling.   The patient tolerated the procedure without complications.      Claude Manges. [name], M.D.  Electronically Signed     PWW/MEDQ  D:  08/12/2008  T:  08/12/2008  Job:  [id number]

## 2010-11-09 NOTE — Assessment & Plan Note (Signed)
[facility]                          [facility]ICE NOTE   NAME:Radliff, EWALD BEG                       MRN:          [id number]  DATE:10/04/2007                            DOB:          08-20-1925    PRIMARY CARE PHYSICIAN:  [name], MD   REASON FOR VISIT:  Follow-up of paroxysmal atrial fibrillation.   HISTORY OF PRESENT ILLNESS:  Mr. Wiseman comes in for a 2-year visit.  He  has actually continued to do fairly well without any significant  palpitations and an electrocardiogram today shows sinus rhythm with a  prolonged PR interval of 262 milliseconds which is somewhat progressed  but not markedly so.  He has had no problems with dizziness or syncope.  He does have dyspnea on exertion at NYHA class II but no chest pain.  We  talked about a basic walking regimen today.  As mentioned previously, he  is not on Coumadin or aspirin given prior problems with significant  gastrointestinal hemorrhage.   ALLERGIES:  ASPIRIN (hives).  General intolerances to Coumadin and  Plavix due to recurrent gastrointestinal hemorrhages.   CURRENT MEDICATIONS:  1. Verapamil SR 240 mg p.o. daily.  2. Terazosin 5 mg p.o. daily.  3. Multivitamin one p.o. daily.  4. Prilosec 20 mg p.o. daily.  5. Sublingual nitroglycerin 0.4 mg p.r.n.   REVIEW OF SYSTEMS:  As described in the history of present illness.  Otherwise negative.   PHYSICAL EXAMINATION:  Blood pressure is 135/68, heart rate is 71,  weight is 185.6 pounds.  The patient is comfortable in no acute  distress.  HEENT:  Conjunctiva is normal. Pharynx clear.  NECK:  Supple.  No elevated jugular venous pressure, no loud bruits.  LUNGS:  Clear without labored breathing.  CARDIAC:  Reveals a regular rate and rhythm.  No loud murmur or gallop.  EXTREMITIES:  No significant pitting edema.   IMPRESSION/RECOMMENDATIONS:  1. History of paroxysmal atrial fibrillation, stable symptomatically      and in sinus  rhythm electrocardiogram today.  We will plan to      continue his verapamil. He is on neither aspirin or      Coumadin as detailed above.  2. Hypertension, followed by Dr. Clelia Croft. Will continue his regular      follow-up there.     Jonelle Sidle, MD  Electronically Signed    SGM/MedQ  DD: 10/04/2007  DT: 10/05/2007  Job #: [id number]   cc:   [name], MD

## 2010-11-09 NOTE — Assessment & Plan Note (Signed)
[facility]                          [facility]ICE NOTE   NAME:Glenn Thornton, Glenn Thornton                       MRN:          [id number]  DATE:08/11/2008                            DOB:          Oct 03, 1925    PRIMARY CARE PHYSICIAN:  [name], M.D.   ORTHOPEDIC SURGEON:  Claude Manges. [name], M.D.   REASON FOR VISIT:  Preoperative evaluation.   HISTORY OF PRESENT ILLNESS:  I saw Glenn Thornton in April 2009.  He has a  history of paroxysmal atrial fibrillation managed with long-acting  verapamil and symptomatically very well-controlled.  He is not reporting  any problems with chest pain, progressive shortness of breath, or  breakthrough palpitations.  In the past he has been intolerant to  ASPIRIN, COUMADIN and PLAVIX, with previous history of hives on ASPIRIN  and recurrent gastrointestinal hemorrhage on COUMADIN and PLAVIX.  His  electrocardiogram today shows sinus rhythm with a prolonged PR interval  of 220 msec, which is actually decreased compared to his previous  tracing (262 msec in April 2009).  He has had no problems with dizziness  or syncope.  Glenn Thornton is being considered for an elective left  shoulder joint replacement under general anesthesia due to progressive  arthritic discomfort.  This is to be performed tomorrow morning at [facility].   ALLERGIES:  Intolerances to ASPIRIN, COUMADIN and PLAVIX as outlined  above.   PRESENT MEDICATIONS INCLUDE:  1. Verapamil SR 240 mg p.o. daily.  2. Multivitamin 1 p.o. daily.  3. Prilosec 20 mg p.o. daily.  4. Terazosin 5 mg 2 tablets p.o. q.h.s.  5. Iron supplements daily.  6. Allegra 180 mg p.r.n.   REVIEW OF SYSTEMS:  As outlined above, otherwise negative.   PHYSICAL EXAMINATION:  Blood pressure today 115/60, heart rate 90 and  regular, weight is 175 pounds.  The patient is comfortable and in no  acute distress.  HEENT:  Conjunctivae are normal.  Pharynx clear.  NECK:  Supple.   No elevated jugular venous pressure, no loud bruits, no  thyromegaly is noted.  LUNGS:  Clear without wheezing or rales.  CARDIAC EXAM:  Reveals a regular rate and rhythm without murmur or  gallop.  ABDOMEN:  Soft, nontender.  Normoactive bowel sounds.  EXTREMITIES:  Exhibit no frank pitting edema.  His pulses are 1+.  SKIN:  Warm and dry.  MUSCULOSKELETAL:  No kyphosis noted.  NEUROPSYCHIATRIC:  The patient is alert and oriented x3.  Affect is  appropriate.   IMPRESSION AND RECOMMENDATIONS:  Preoperative evaluation in a pleasant  elderly gentleman with known history of paroxysmal atrial fibrillation,  although with very good symptom control on long-acting verapamil, and a  normal sinus rhythm today.  He has had intolerances to ASPIRIN, PLAVIX  and COUMADIN as detailed above, and is therefore on no specific  antiplatelet or anticoagulant regimen.  He has had no chest pain or  shortness of breath with no prior documented history of obstructive  coronary artery disease or myocardial infarction.  Previous assessment  of left ventricular systolic function via echocardiography in 2008  revealed an ejection fraction of 50-60% with aortic valve sclerosis and  no other major valvular abnormalities.  Clinically, Glenn Thornton has been  stable and I would anticipate that he should be able to proceed with  planned elective surgery with an acceptable perioperative cardiac risk.  If he does manifest any perioperative arrhythmias, our consultative  cardiology service can be contacted.  I would plan to continue his  present dose of verapamil SR, and we will see him back over the next 6  months otherwise if he does well.     Jonelle Sidle, MD  Electronically Signed    SGM/MedQ  DD: 08/11/2008  DT: 08/11/2008  Job #: [id number]   cc:   Claude Manges. [name], M.D.  [name], MD

## 2011-01-14 DIAGNOSIS — I4892 Unspecified atrial flutter: Secondary | ICD-10-CM

## 2011-01-14 DIAGNOSIS — I4891 Unspecified atrial fibrillation: Secondary | ICD-10-CM

## 2011-01-17 DIAGNOSIS — R079 Chest pain, unspecified: Secondary | ICD-10-CM

## 2011-02-24 ENCOUNTER — Encounter: Payer: Self-pay | Admitting: Cardiology

## 2011-02-25 ENCOUNTER — Encounter: Payer: Self-pay | Admitting: Cardiology

## 2011-02-25 ENCOUNTER — Ambulatory Visit (INDEPENDENT_AMBULATORY_CARE_PROVIDER_SITE_OTHER): Payer: Medicare Other | Admitting: Cardiology

## 2011-02-25 VITALS — BP 122/65 | HR 62 | Ht 71.0 in | Wt 173.0 lb

## 2011-02-25 DIAGNOSIS — I4892 Unspecified atrial flutter: Secondary | ICD-10-CM | POA: Insufficient documentation

## 2011-02-25 DIAGNOSIS — I10 Essential (primary) hypertension: Secondary | ICD-10-CM

## 2011-02-25 DIAGNOSIS — I2510 Atherosclerotic heart disease of native coronary artery without angina pectoris: Secondary | ICD-10-CM

## 2011-02-25 DIAGNOSIS — I4891 Unspecified atrial fibrillation: Secondary | ICD-10-CM

## 2011-02-25 NOTE — Patient Instructions (Signed)
Follow up as scheduled. Your physician recommends that you continue on your current medications as directed. Please refer to the Current Medication list given to you today. 

## 2011-02-25 NOTE — Assessment & Plan Note (Signed)
Known history, recent rhythm has been atrial flutter.

## 2011-02-25 NOTE — Assessment & Plan Note (Signed)
Based on noninvasive cardiac testing over the years. He states he had a cardiac catheterization many years ago in [place]. Plan to continue medical therapy. I suspect he has disease principally in the RCA distribution. He is not reporting any angina or nitroglycerin use.

## 2011-02-25 NOTE — Progress Notes (Signed)
Clinical Summary Glenn Thornton is a 77 y.o.male presenting for followup. He was seen in January in the office, more recently in consultation and more had back in July with atrial flutter on baseline history of atrial fibrillation. He ruled out for myocardial infarction, and underwent followup ischemic testing, with overall low risk Cardiolite, and LVEF of 50-55% by echocardiogram. He has been managed medically for underlying CAD, no active angina, with relatively low risk ischemic testing over the years.  Other testing including a VQ scan which was low probability for PE, findings of no significant peripheral arterial disease by Dopplers, chest x-ray consistent with COPD.  He was initiated on Plavix, however not fully anticoagulated with prior history of significant GI bleeding on Coumadin. He reports no significant melena or hematochezia at this point. ECG from hospital stay in July was reviewed showing atrial flutter with 4:1 block.  He is here with a nursing aid today. Reports no significant palpitations. Does get somewhat short of breath when he initially gets up to walk, however this resolves as he continues. No chest pain, no nitroglycerin glycerin use.  We reviewed his testing from recent hospitalization.   Allergies  Allergen Reactions  . Aspirin     REACTION: hives  . Warfarin Sodium     REACTION: gi problems    Medication list reviewed.  Past Medical History  Diagnosis Date  . GERD (gastroesophageal reflux disease)   . Arthritis   . Atrial fibrillation   . GI bleed   . Temporal arteritis   . Carotid artery disease   . Atrial flutter   . Coronary atherosclerosis of native coronary artery     Based on abnormal Cardiolite    Social History Glenn Thornton reports that he quit smoking about 16 years ago. His smoking use included Cigarettes. He has a 50 pack-year smoking history. He has never used smokeless tobacco. Glenn Thornton reports that he does not drink alcohol.  Review of  Systems As outlined above, otherwise negative.  Physical Examination Filed Vitals:   02/25/11 0948  BP: 122/65  Pulse: 62   Overweight male in no acute distress.  HEENT: Conjunctiva and lids normal, oropharynx with moist mucosa.  Neck: Supple, no elevated JVP or bruits.  Lungs: Clear auscultation, nonlabored.  Cardiac: Irregularly irregular, no S3.  Abdomen: Soft, nontender.  Extremity: No pitting edema, distal pulses 1-2+.  Skin: Warm and dry.  Musculoskeletal: No gross deformities. He does have some limited movement of his right shoulder.  Neuropsychiatric: Alert and oriented x3, affect appropriate.   ECG Refused.  Studies Lexiscan Cardiolite 10/13/2009: No diagnostic ST segment changes. LVEF 62%. Medium, partially reversible, mid to basal inferior defect suggesting ischemia, however in the setting of subdiaphragmatic uptake at rest.   Problem List and Plan

## 2011-02-25 NOTE — Assessment & Plan Note (Signed)
Diagnosed in July, rate controlled on medical therapy. He has a baseline history of atrial fibrillation, on Coumadin with history of recurrent GI bleeding. He was placed on Plavix during his hospitalization, tolerating it so far.

## 2011-02-25 NOTE — Assessment & Plan Note (Signed)
Continue medical therapy, sodium restriction.

## 2011-05-30 ENCOUNTER — Encounter: Payer: Self-pay | Admitting: Cardiology

## 2011-05-31 ENCOUNTER — Ambulatory Visit: Payer: Medicare Other | Admitting: Cardiology

## 2011-07-08 ENCOUNTER — Ambulatory Visit: Payer: Medicare Other | Admitting: Cardiology

## 2011-08-30 ENCOUNTER — Telehealth: Payer: Self-pay | Admitting: *Deleted

## 2011-08-30 NOTE — Telephone Encounter (Signed)
Pt will stand up and gets completely out of breath. They have seen primary MD 3 times and he can't seem to figure out what's going on.He also has decreased energy. Pt last seen 02/25/11. He was scheduled for office visits on 12/4 and 1/11, but both of these appts were cancelled due to transportation.  Pt's dgt states they have CNA's now to help get him to appts. Pt scheduled for first available with Dr. [name] on 10/04/11.

## 2011-09-01 DIAGNOSIS — R0602 Shortness of breath: Secondary | ICD-10-CM

## 2011-09-01 DIAGNOSIS — I4891 Unspecified atrial fibrillation: Secondary | ICD-10-CM

## 2011-09-02 DIAGNOSIS — I5031 Acute diastolic (congestive) heart failure: Secondary | ICD-10-CM

## 2011-09-20 ENCOUNTER — Other Ambulatory Visit: Payer: Self-pay

## 2011-10-04 ENCOUNTER — Ambulatory Visit: Payer: Medicare Other | Admitting: Cardiology

## 2011-11-16 ENCOUNTER — Ambulatory Visit (INDEPENDENT_AMBULATORY_CARE_PROVIDER_SITE_OTHER): Payer: Medicare Other | Admitting: Cardiology

## 2011-11-16 ENCOUNTER — Encounter: Payer: Self-pay | Admitting: Cardiology

## 2011-11-16 VITALS — BP 95/48 | HR 63 | Resp 20 | Ht 71.0 in | Wt 180.0 lb

## 2011-11-16 DIAGNOSIS — I4891 Unspecified atrial fibrillation: Secondary | ICD-10-CM

## 2011-11-16 DIAGNOSIS — I2510 Atherosclerotic heart disease of native coronary artery without angina pectoris: Secondary | ICD-10-CM

## 2011-11-16 DIAGNOSIS — I10 Essential (primary) hypertension: Secondary | ICD-10-CM

## 2011-11-16 DIAGNOSIS — D500 Iron deficiency anemia secondary to blood loss (chronic): Secondary | ICD-10-CM

## 2011-11-16 MED ORDER — NITROGLYCERIN 0.4 MG SL SUBL: 0.4000 mg | SUBLINGUAL_TABLET | SUBLINGUAL | Status: AC | PRN

## 2011-11-16 NOTE — Progress Notes (Signed)
Clinical Summary Glenn Thornton is a 78 y.o.male presenting for followup. He was seen in August 2012.  Record review finds hospital admission at [facility] back in March following a fall with right hip fracture, underwent placement of an intramedullary nail. It appears that he was taken off of Plavix by Dr. Sherryll Burger. Lab work from March showed BUN 47, creatinine 2.1, hemoglobin 11.4, platelets 244 Additional records from April indicate that the patient was subsequently noted to be severely anemic with hemoglobin down to 6.9, was hospitalized in [place] requiring red cell transfusion. He was reportedly Hemoccult negative.  He has an aspirin allergy, has not been back on Plavix, and was previously felt to be a poor anticoagulation candidate with history of GI bleed. We reviewed this issue again today, and have elected to keep him off all of these agents going forward.  He is doing rehabilitation at a nursing facility, plans to go home at the end of the week. Reports no angina, no sense of palpitations. Has had no falls.   Allergies  Allergen Reactions  . Aspirin     REACTION: hives  . Warfarin Sodium     REACTION: gi problems    Current Outpatient Prescriptions  Medication Sig Dispense Refill  . acetaminophen (TYLENOL) 500 MG tablet Take 500 mg by mouth as needed.        . ALPRAZolam (XANAX) 0.5 MG tablet Take 0.5 mg by mouth at bedtime as needed.      . bicalutamide (CASODEX) 50 MG tablet Take 50 mg by mouth daily.      Marland Kitchen docusate sodium (COLACE) 100 MG capsule Take 100 mg by mouth 2 (two) times daily.      Marland Kitchen escitalopram (LEXAPRO) 10 MG tablet Take 10 mg by mouth daily.      . ferrous sulfate 325 (65 FE) MG tablet Take 325 mg by mouth 3 (three) times daily with meals.      . finasteride (PROSCAR) 5 MG tablet Take 5 mg by mouth daily.      Marland Kitchen ipratropium-albuterol (DUONEB) 0.5-2.5 (3) MG/3ML SOLN Take 3 mLs by nebulization every 8 (eight) hours as needed.      . Magnesium Hydroxide (MILK OF  MAGNESIA PO) Take by mouth as needed.      . multivitamin (THERAGRAN) per tablet Take 1 tablet by mouth daily.        . ondansetron (ZOFRAN) 4 MG tablet Take 4 mg by mouth every 8 (eight) hours as needed.      . predniSONE (DELTASONE) 10 MG tablet Take 10 mg by mouth as directed.       . ranitidine (ZANTAC) 150 MG tablet Take 150 mg by mouth 2 (two) times daily.        . Tamsulosin HCl (FLOMAX) 0.4 MG CAPS Take 0.4 mg by mouth daily.        Marland Kitchen tiotropium (SPIRIVA) 18 MCG inhalation capsule Place 18 mcg into inhaler and inhale daily.      . verapamil (COVERA HS) 240 MG (CO) 24 hr tablet Take 240 mg by mouth daily.          Past Medical History  Diagnosis Date  . GERD (gastroesophageal reflux disease)   . Arthritis   . Atrial fibrillation   . GI bleed   . Temporal arteritis   . Carotid artery disease   . Atrial flutter   . Coronary atherosclerosis of native coronary artery     Based on abnormal Cardiolite    Social  History Glenn Thornton reports that he quit smoking about 17 years ago. His smoking use included Cigarettes. He has a 50 pack-year smoking history. He has never used smokeless tobacco. Glenn Thornton reports that he does not drink alcohol.  Review of Systems Stable appetite. No melena or hematochezia. No fevers or chills. Some hip pain. Able to ambulate, still uses wheelchair intermittently. Otherwise negative.  Physical Examination Filed Vitals:   11/16/11 1353  BP: 95/48  Pulse: 63  Resp: 20   Elderly male in no acute distress. Seated in wheelchair. HEENT: Conjunctiva and lids normal, oropharynx with moist mucosa.  Neck: Supple, no elevated JVP or bruits.  Lungs: Clear auscultation, nonlabored.  Cardiac: Regular, no S3.  Abdomen: Soft, nontender.  Extremity: No pitting edema, distal pulses 1-2+.  Skin: Warm and dry.  Musculoskeletal: No gross deformities.  Neuropsychiatric: Alert and oriented x3, affect appropriate.   ECG Underlying atrial fibrillation with  probable junctional rhythm, heart rate 60.  Problem List and Plan   ATRIAL FIBRILLATION Probable underlying atrial fibrillation with intermittent junctional rhythm. He has been clinically stable on verapamil for quite some time. No change made today. He denies any obvious syncope or lightheadedness. As noted previously, he is not a candidate for anticoagulation.  Coronary atherosclerosis of native coronary artery Managing medically at this point. No active angina. Refill given for p.r.n. Nitroglycerin.  ESSENTIAL HYPERTENSION, BENIGN Blood pressure on the low normal side today.  Blood loss anemia History of both GI bleeding and also post surgical bleeding, recently requiring packed red cell transfusions. As noted above, we plan to keep him off antiplatelet and anticoagulant medications going forward.     Jonelle Sidle, M.D., F.A.C.C.

## 2011-11-16 NOTE — Assessment & Plan Note (Signed)
Managing medically at this point. No active angina. Refill given for p.r.n. Nitroglycerin.

## 2011-11-16 NOTE — Assessment & Plan Note (Signed)
Probable underlying atrial fibrillation with intermittent junctional rhythm. He has been clinically stable on verapamil for quite some time. No change made today. He denies any obvious syncope or lightheadedness. As noted previously, he is not a candidate for anticoagulation.

## 2011-11-16 NOTE — Patient Instructions (Signed)
   Nitroglycerin refill sent to local pharmacy  Continue Verapamil as currently taking  No antiplatelet or anticoagulants with history of recurrent bleeding  Follow up in  3 months

## 2011-11-16 NOTE — Assessment & Plan Note (Signed)
Blood pressure on the low normal side today.

## 2011-11-16 NOTE — Assessment & Plan Note (Signed)
History of both GI bleeding and also post surgical bleeding, recently requiring packed red cell transfusions. As noted above, we plan to keep him off antiplatelet and anticoagulant medications going forward.

## 2012-02-20 ENCOUNTER — Encounter: Payer: Self-pay | Admitting: Cardiology

## 2012-02-20 ENCOUNTER — Ambulatory Visit (INDEPENDENT_AMBULATORY_CARE_PROVIDER_SITE_OTHER): Payer: Medicare Other | Admitting: Cardiology

## 2012-02-20 VITALS — BP 179/92 | HR 85 | Ht 71.0 in | Wt 178.8 lb

## 2012-02-20 DIAGNOSIS — Z79899 Other long term (current) drug therapy: Secondary | ICD-10-CM

## 2012-02-20 DIAGNOSIS — E785 Hyperlipidemia, unspecified: Secondary | ICD-10-CM

## 2012-02-20 DIAGNOSIS — I10 Essential (primary) hypertension: Secondary | ICD-10-CM

## 2012-02-20 DIAGNOSIS — I2510 Atherosclerotic heart disease of native coronary artery without angina pectoris: Secondary | ICD-10-CM

## 2012-02-20 DIAGNOSIS — I4891 Unspecified atrial fibrillation: Secondary | ICD-10-CM

## 2012-02-20 NOTE — Patient Instructions (Addendum)
Your physician recommends that you schedule a follow-up appointment in: 4 months.  Your physician recommends that you continue on your current medications as directed. Please refer to the Current Medication list given to you today.  Your physician recommends that you return for a FASTING lipid/liver profile: tomorrow 02/21/12 at [facility]. Please don't eat or drink after midnight tonight for your testing in the morning.

## 2012-02-20 NOTE — Assessment & Plan Note (Signed)
Blood pressure up significantly today. States he just took his medication within the hour. He does check blood pressure at home, reports systolics typically in the 120s to 130s. I have asked him to watch this closely.

## 2012-02-20 NOTE — Progress Notes (Signed)
Clinical Summary Mr. Muilenburg is a 78 y.o.male presenting for followup. He was seen in May. He reports single use of nitroglycerin since last visit. Tries to stay active, using a walker, remains at fairly high risk for falls. He denies any obvious bleeding or changes in his bowel pattern.  As noted previously, we are limited in medical therapy in terms of CAD and atrial fibrillation. He has history of falls and also GI bleeding. We have not had him on antiplatelet or anticoagulant medications recently.  He reports no palpitations. Has not had recent lipid panel. Not on statin therapy at this time.   Allergies  Allergen Reactions  . Aspirin     REACTION: hives  . Warfarin Sodium     REACTION: gi problems    Current Outpatient Prescriptions  Medication Sig Dispense Refill  . acetaminophen (TYLENOL) 500 MG tablet Take 500 mg by mouth as needed.        . ALPRAZolam (XANAX) 0.5 MG tablet Take 0.5 mg by mouth at bedtime as needed.      Marland Kitchen escitalopram (LEXAPRO) 10 MG tablet Take 10 mg by mouth daily.      Marland Kitchen ipratropium-albuterol (DUONEB) 0.5-2.5 (3) MG/3ML SOLN Take 3 mLs by nebulization every 8 (eight) hours as needed.      . multivitamin (THERAGRAN) per tablet Take 1 tablet by mouth daily.        . predniSONE (DELTASONE) 10 MG tablet Take 20 mg by mouth daily.       . ranitidine (ZANTAC) 150 MG tablet Take 150 mg by mouth 2 (two) times daily.        . Tamsulosin HCl (FLOMAX) 0.4 MG CAPS Take 0.4 mg by mouth daily.        . verapamil (COVERA HS) 240 MG (CO) 24 hr tablet Take 240 mg by mouth daily.        . nitroGLYCERIN (NITROSTAT) 0.4 MG SL tablet Place 1 tablet (0.4 mg total) under the tongue every 5 (five) minutes as needed. May repeat for up to 3 doses.  25 tablet  3    Past Medical History  Diagnosis Date  . GERD (gastroesophageal reflux disease)   . Arthritis   . Atrial fibrillation   . GI bleed   . Temporal arteritis   . Carotid artery disease   . Atrial flutter   . Coronary  atherosclerosis of native coronary artery     Based on abnormal Cardiolite    Social History Mr. Dillon reports that he quit smoking about 17 years ago. His smoking use included Cigarettes. He has a 50 pack-year smoking history. He has never used smokeless tobacco. Mr. Devine reports that he does not drink alcohol.  Review of Systems Reports stable appetite. No orthopnea or PND. No recent falls.Negative except as outlined above.  Physical Examination Filed Vitals:   02/20/12 1054  BP: 179/92  Pulse: 85    Elderly male in no acute distress. Uses walker. HEENT: Conjunctiva and lids normal, oropharynx with moist mucosa.  Neck: Supple, no elevated JVP or bruits.  Lungs: Clear auscultation, nonlabored.  Cardiac: Irregular, no S3. Soft systolic murmur. Abdomen: Soft, nontender.  Extremity: No pitting edema, distal pulses 1-2+.  Skin: Warm and dry.  Musculoskeletal: No gross deformities.     Problem List and Plan   ATRIAL FIBRILLATION Underlying atrial fibrillation with intermittent junctional rhythm. He has been clinically stable on verapamil for quite some time. As noted previously, he is not a candidate for anticoagulation.  Coronary atherosclerosis of native coronary artery Managing medically at this point. No active angina. At this point not on statin therapy, was otherwise limited medical treatment. Would like to obtain a lipid profile to see if he might benefit from further medical adjustment.  ESSENTIAL HYPERTENSION, BENIGN Blood pressure up significantly today. States he just took his medication within the hour. He does check blood pressure at home, reports systolics typically in the 120s to 130s. I have asked him to watch this closely.    Jonelle Sidle, M.D., F.A.C.C.

## 2012-02-20 NOTE — Assessment & Plan Note (Signed)
Underlying atrial fibrillation with intermittent junctional rhythm. He has been clinically stable on verapamil for quite some time. As noted previously, he is not a candidate for anticoagulation.

## 2012-02-20 NOTE — Assessment & Plan Note (Addendum)
Managing medically at this point. No active angina. At this point not on statin therapy, was otherwise limited medical treatment. Would like to obtain a lipid profile to see if he might benefit from further medical adjustment.

## 2012-02-22 ENCOUNTER — Telehealth: Payer: Self-pay | Admitting: *Deleted

## 2012-02-22 MED ORDER — ATORVASTATIN CALCIUM 20 MG PO TABS: 20.0000 mg | ORAL_TABLET | Freq: Every day | ORAL | Status: DC

## 2012-02-22 NOTE — Telephone Encounter (Signed)
Message copied by Eustace Moore on Wed Feb 22, 2012  4:41 PM ------      Message from: MCDOWELL, Illene Bolus      Created: Wed Feb 22, 2012  9:16 AM       Reviewed. Cholesterol 284 and LDL 187. Would suggest starting a statin if he is agreeable. Could try Lipitor 20 mg daily.

## 2012-02-22 NOTE — Telephone Encounter (Signed)
Patient informed. 

## 2012-11-05 ENCOUNTER — Telehealth: Payer: Self-pay | Admitting: *Deleted

## 2012-11-05 ENCOUNTER — Encounter: Payer: Self-pay | Admitting: Physician Assistant

## 2012-11-05 NOTE — Telephone Encounter (Signed)
Daughter called requesting an appointment for patient to be seen today for LE swelling for past 2 weeks. Daughter stated PCP saw patient and said he would prescribe lasix but prescription was never called in. Patient denies sob or chest pain. Daughter said patient does c/o of dizziness. Nurse advised daughter to have patient seen by PCP and first available appointment given to patient.

## 2012-11-07 ENCOUNTER — Encounter: Payer: Self-pay | Admitting: Physician Assistant

## 2012-11-09 ENCOUNTER — Encounter: Payer: Self-pay | Admitting: Physician Assistant

## 2012-11-09 ENCOUNTER — Ambulatory Visit (INDEPENDENT_AMBULATORY_CARE_PROVIDER_SITE_OTHER): Payer: Medicare Other | Admitting: Physician Assistant

## 2012-11-09 ENCOUNTER — Telehealth: Payer: Self-pay | Admitting: Physician Assistant

## 2012-11-09 VITALS — BP 129/57 | HR 53 | Ht 71.0 in | Wt 182.8 lb

## 2012-11-09 DIAGNOSIS — I739 Peripheral vascular disease, unspecified: Secondary | ICD-10-CM

## 2012-11-09 DIAGNOSIS — R600 Localized edema: Secondary | ICD-10-CM

## 2012-11-09 DIAGNOSIS — I10 Essential (primary) hypertension: Secondary | ICD-10-CM

## 2012-11-09 DIAGNOSIS — R0602 Shortness of breath: Secondary | ICD-10-CM

## 2012-11-09 DIAGNOSIS — N289 Disorder of kidney and ureter, unspecified: Secondary | ICD-10-CM

## 2012-11-09 DIAGNOSIS — R609 Edema, unspecified: Secondary | ICD-10-CM

## 2012-11-09 DIAGNOSIS — I2510 Atherosclerotic heart disease of native coronary artery without angina pectoris: Secondary | ICD-10-CM

## 2012-11-09 DIAGNOSIS — I4891 Unspecified atrial fibrillation: Secondary | ICD-10-CM

## 2012-11-09 MED ORDER — POTASSIUM CHLORIDE CRYS ER 20 MEQ PO TBCR: 20.0000 meq | EXTENDED_RELEASE_TABLET | Freq: Every day | ORAL | Status: AC

## 2012-11-09 NOTE — Assessment & Plan Note (Signed)
Quiescent on current medication regimen, absent ASA. Negative Lexiscan Cardiolite, 12/2010.

## 2012-11-09 NOTE — Assessment & Plan Note (Addendum)
Permanent AF with SVR, on same dose of verapamil. Continue watchful waiting for now, although we may need to decrease dose at time of next OV, if his HR remains persistently low. As previously documented, patient is not a candidate for anticoagulation, secondary to history of falls and remote GI bleed. He is also allergic to ASA. Would consider addition of Plavix in near future, particularly in light of recently documented PAD.

## 2012-11-09 NOTE — Assessment & Plan Note (Signed)
Stable by recent labs with creatinine 1.7. Will monitor closely with followup labs.

## 2012-11-09 NOTE — Progress Notes (Addendum)
Primary Cardiologist: Simona Huh, MD   HPI: Patient presents as an add-on for evaluation of peripheral edema, per Dr. Thomes Lolling request.   - Recent laboratory data, May 12: BUN 33, creatinine 1.7, potassium 3.7. BNP 460   - LE arterial Dopplers (ABI): 0.74 right; 1.1 left  Patient presents with recent onset bilateral peripheral edema, with no associated orthopnea, PND, or CP. He was recently started on Lasix 40 daily, but reports no significant improvement. He also had LE arterial Dopplers, with results as outlined above. Of note, he denies any intermittent claudication, although he is extremely limited in his mobility, essentially confined to a wheelchair.   12-lead EKG today by me, indicates atrial fibrillation 52 bpm; NSST changes  Allergies  Allergen Reactions  . Aspirin     REACTION: hives  . Warfarin Sodium     REACTION: gi problems    Current Outpatient Prescriptions  Medication Sig Dispense Refill  . ALPRAZolam (XANAX) 0.5 MG tablet Take 0.5 mg by mouth as needed.       . finasteride (PROSCAR) 5 MG tablet Take 5 mg by mouth at bedtime.      . furosemide (LASIX) 40 MG tablet Take 40 mg by mouth daily.      Marland Kitchen ipratropium-albuterol (DUONEB) 0.5-2.5 (3) MG/3ML SOLN Take 3 mLs by nebulization every 8 (eight) hours as needed.      . multivitamin (THERAGRAN) per tablet Take 1 tablet by mouth daily.        Marland Kitchen omeprazole (PRILOSEC) 20 MG capsule Take 20 mg by mouth daily.      . polyethylene glycol (MIRALAX / GLYCOLAX) packet Take 17 g by mouth as needed.      . predniSONE (DELTASONE) 10 MG tablet Take 10 mg by mouth 2 (two) times daily.       . Red Yeast Rice Extract (RED YEAST RICE PO) Take 2 capsules by mouth every morning.      . Tamsulosin HCl (FLOMAX) 0.4 MG CAPS Take 0.8 mg by mouth at bedtime.       . verapamil (COVERA HS) 240 MG (CO) 24 hr tablet Take 240 mg by mouth daily.        . nitroGLYCERIN (NITROSTAT) 0.4 MG SL tablet Place 1 tablet (0.4 mg total) under the tongue  every 5 (five) minutes as needed. May repeat for up to 3 doses.  25 tablet  3  . potassium chloride SA (K-DUR,KLOR-CON) 20 MEQ tablet Take 1 tablet (20 mEq total) by mouth daily.  30 tablet  6   No current facility-administered medications for this visit.    Past Medical History  Diagnosis Date  . GERD (gastroesophageal reflux disease)   . Arthritis   . Atrial fibrillation   . GI bleed   . Temporal arteritis   . Carotid artery disease   . Atrial flutter   . Coronary atherosclerosis of native coronary artery     Based on abnormal Cardiolite  . Renal insufficiency   . Peripheral arterial disease     ABIs: 0.74 right; 1.1 left, 10/2012    Past Surgical History  Procedure Laterality Date  . Mumford procedure, acromioplasty  10/31/2006  . Left shoulder surgery  2002  . Left knee surgery  1994  . Hernia repair  1999  . Cataract surgery    . Shoulder arthroscopy      Prior  . Rotator cuff repair      History   Social History  . Marital Status: Married  Spouse Name: N/A    Number of Children: N/A  . Years of Education: N/A   Occupational History  . RETIRED    Social History Main Topics  . Smoking status: Former Smoker -- 1.00 packs/day for 50 years    Types: Cigarettes    Quit date: 06/27/1994  . Smokeless tobacco: Never Used     Comment: Quit smoking approximately 15 years ago  . Alcohol Use: No     Comment: Quit Drinking in 1995  . Drug Use: No  . Sexually Active: Not on file   Other Topics Concern  . Not on file   Social History Narrative   Married Lives in [place], [place]     No family history on file.  ROS: no nausea, vomiting; no fever, chills; no melena, hematochezia; no claudication  PHYSICAL EXAM: BP 129/57  Pulse 53  Ht 5\' 11"  (1.803 m)  Wt 182 lb 12.8 oz (82.918 kg)  BMI 25.51 kg/m2  SpO2 98% GENERAL: 79 year-old male, chronically ill-appearing; NAD HEENT: NCAT, PERRLA, EOMI; sclera clear; no xanthelasma NECK: palpable bilateral carotid  pulses, no bruits; no JVD; no TM LUNGS: Faint, late crackles in left base, no wheezes CARDIAC: RRR (S1, S2); no significant murmurs; no rubs or gallops ABDOMEN: soft, non-tender; intact BS EXTREMETIES: 2+ bilateral peripheral edema (R > L), with 3+ edema; nonpalpable DPs; venous vericosity SKIN: warm/dry; no obvious rash/lesions MUSCULOSKELETAL: no joint deformity NEURO: no focal deficit; NL affect   EKG: reviewed and available in Electronic Records   ASSESSMENT & PLAN:  Peripheral edema Will order complete echocardiogram to assess RV function. Patient has history of NL LVF and RVF, by echocardiography in July 2012. There was also no suggestion of significant PHTN. If echocardiogram is negative for evidence of RV dysfunction, then this would suggest that his peripheral edema is due to to venous insufficiency. His exam is certainly suggestive of this. Additionally, we will increase Lasix to 40 twice a day (x1 week), then resume maintenance dose 40 daily. Will add supplemental potassium 20 daily. Will order CXR to rule out pulmonary edema. Will check followup metabolic profile and BNP level in 5 days, and arrange for early clinic followup.  ATRIAL FIBRILLATION Permanent AF with SVR, on same dose of verapamil. Continue watchful waiting for now, although we may need to decrease dose at time of next OV, if his HR remains persistently low. As previously documented, patient is not a candidate for anticoagulation, secondary to history of falls and remote GI bleed. He is also allergic to ASA. Would consider addition of Plavix in near future, particularly in light of recently documented PAD.  Coronary atherosclerosis of native coronary artery Quiescent on current medication regimen, absent ASA. Negative Lexiscan Cardiolite, 12/2010.  Peripheral arterial disease Results of recent ABIs were reviewed with patient and family. He currently denies any associated symptoms. Will defer referral to Dr. Kirke Corin for  further recommendations, at this point in time, but will revisit this in the near future.  Renal insufficiency Stable by recent labs with creatinine 1.7. Will monitor closely with followup labs.  ESSENTIAL HYPERTENSION, BENIGN Well-controlled on current medication regimen    Gene Emmaleah Meroney, PAC

## 2012-11-09 NOTE — Addendum Note (Signed)
Addended by: Lesle Chris on: 11/09/2012 04:24 PM   Modules accepted: Orders

## 2012-11-09 NOTE — Assessment & Plan Note (Signed)
Results of recent ABIs were reviewed with patient and family. He currently denies any associated symptoms. Will defer referral to Dr. Kirke Corin for further recommendations, at this point in time, but will revisit this in the near future.

## 2012-11-09 NOTE — Telephone Encounter (Signed)
Dr. Leary Roca office called stating Glenn Thornton is having congestion, bilateral foot swelling, Had labs and Vascular studies Done at [facility] this week. Studies have been scanned into Epic. Dr. Sharlot Gowda is requesting that we see Glenn Thornton asap. Please call his daughter with instructions.

## 2012-11-09 NOTE — Telephone Encounter (Signed)
Please add on this afternoon for either my schedule or Gene's schedule. Patient may have to wait, realizing that both schedules are full.

## 2012-11-09 NOTE — Assessment & Plan Note (Signed)
Well-controlled on current medication regimen 

## 2012-11-09 NOTE — Patient Instructions (Signed)
   Echo  Chest x-ray  Increase Lasix to 40mg  twice a day  X 1 week only, then resume previous dose  Begin K-Dur (potassium) daily Continue all other current medications. Follow up in  2 weeks

## 2012-11-09 NOTE — Assessment & Plan Note (Addendum)
Will order complete echocardiogram to assess RV function. Patient has history of NL LVF and RVF, by echocardiography in July 2012. There was also no suggestion of significant PHTN. If echocardiogram is negative for evidence of RV dysfunction, then this would suggest that his peripheral edema is due to to venous insufficiency. His exam is certainly suggestive of this. Additionally, we will increase Lasix to 40 twice a day (x1), then resume maintenance dose 40 daily. Will add supplemental potassium 20 daily. Will order CXR to rule out pulmonary edema. Will check followup metabolic profile and BNP level in 5 days, and arrange for early clinic followup.

## 2012-11-13 ENCOUNTER — Other Ambulatory Visit: Payer: Self-pay | Admitting: *Deleted

## 2012-11-13 DIAGNOSIS — I4891 Unspecified atrial fibrillation: Secondary | ICD-10-CM

## 2012-11-14 ENCOUNTER — Other Ambulatory Visit: Payer: Self-pay

## 2012-11-14 ENCOUNTER — Other Ambulatory Visit (INDEPENDENT_AMBULATORY_CARE_PROVIDER_SITE_OTHER): Payer: Medicare Other

## 2012-11-14 DIAGNOSIS — R609 Edema, unspecified: Secondary | ICD-10-CM

## 2012-11-21 ENCOUNTER — Telehealth: Payer: Self-pay | Admitting: *Deleted

## 2012-11-21 NOTE — Telephone Encounter (Signed)
Message copied by Eustace Moore on Wed Nov 21, 2012  9:27 AM ------      Message from: MCDOWELL, Illene Bolus      Created: Thu Nov 15, 2012 10:59 AM       LVEF remains normal, possible area of hypokinesis in the distal inferior wall (known CAD). Diastolic assessment was not complete. In terms of valvular status, no aortic stenosis, only mild mitral regurgitation, trivial tricuspid regurgitation. Normal RV function. ------

## 2012-11-21 NOTE — Telephone Encounter (Signed)
Patient informed. 

## 2012-11-22 ENCOUNTER — Ambulatory Visit: Payer: Medicare Other | Admitting: Physician Assistant

## 2012-11-22 ENCOUNTER — Ambulatory Visit (INDEPENDENT_AMBULATORY_CARE_PROVIDER_SITE_OTHER): Payer: Medicare Other | Admitting: Cardiology

## 2012-11-22 ENCOUNTER — Encounter: Payer: Self-pay | Admitting: Cardiology

## 2012-11-22 VITALS — BP 190/74 | HR 79 | Ht 71.0 in | Wt 184.0 lb

## 2012-11-22 DIAGNOSIS — R809 Proteinuria, unspecified: Secondary | ICD-10-CM

## 2012-11-22 DIAGNOSIS — M79605 Pain in left leg: Secondary | ICD-10-CM

## 2012-11-22 DIAGNOSIS — N289 Disorder of kidney and ureter, unspecified: Secondary | ICD-10-CM

## 2012-11-22 DIAGNOSIS — R609 Edema, unspecified: Secondary | ICD-10-CM

## 2012-11-22 DIAGNOSIS — I739 Peripheral vascular disease, unspecified: Secondary | ICD-10-CM

## 2012-11-22 DIAGNOSIS — I4891 Unspecified atrial fibrillation: Secondary | ICD-10-CM

## 2012-11-22 DIAGNOSIS — D500 Iron deficiency anemia secondary to blood loss (chronic): Secondary | ICD-10-CM

## 2012-11-22 DIAGNOSIS — I10 Essential (primary) hypertension: Secondary | ICD-10-CM

## 2012-11-22 DIAGNOSIS — M79609 Pain in unspecified limb: Secondary | ICD-10-CM

## 2012-11-22 DIAGNOSIS — I2510 Atherosclerotic heart disease of native coronary artery without angina pectoris: Secondary | ICD-10-CM

## 2012-11-22 NOTE — Assessment & Plan Note (Signed)
Blood pressure seems to be up and down based on home measurements versus office measurements. I expect that he will need an additional agent, states that he is compliant with verapamil. Somewhat reluctant to start anything new now until more information is available pending further testing. We will obtain renal artery duplex studies as well prior to his visit with Dr. Kirke Corin.

## 2012-11-22 NOTE — Assessment & Plan Note (Signed)
Continue strategy of heart rate control, prior documentation of GI bleeding and poor candidacy for anticoagulation. He has not even been able to stay on aspirin consistently.

## 2012-11-22 NOTE — Assessment & Plan Note (Signed)
Diagnosis based on previous noninvasive imaging, managed conservatively. No active angina symptoms. Recent LVEF documented at 55-60% with distal inferior hypokinesis.

## 2012-11-22 NOTE — Progress Notes (Signed)
Clinical Summary Glenn Thornton is an 79 y.o.male presenting for a followup visit, recently seen by Mr. Serpe PA-C on 5/16. He had been referred back to the office at that time secondary to edema, also abnormal peripheral arterial studies consistent with PAD, particularly on the right side with an ABI of 0.74.  At the last visit Lasix was intensified to 40 mg twice daily for one week then back to maintenance dose at once daily with appropriate potassium supplementation. Followup testing included chest x-ray demonstrating thoracic vertebral compression likely osteoporotic with low bone density, no focal infiltrates, no effusions, stable cardiomegaly and probable COPD. Echocardiogram demonstrated LVEF 55-60% with distal inferior hypokinesis, mildly thickened aortic valve with reduced non-coronary cusp excursion - no definite stenosis, mild mitral regurgitation, moderate left atrial enlargement, no pericardial effusion.   Lab work from 5/21 showed BUN 34, creatinine 1.6 (stable), GFR 41, potassium 4.2, BNP 540. His weight has gone up just over a pound since last visit. He has actually continued the Lasix at twice daily, has not experienced any improvement in his edema which is mainly centered around his ankles and dorsum of his feet, left worse than right.  Actually the symptoms seem to be progressing. He does report some tingling and pain in his legs, is not all that active, uses a wheelchair. Not entirely clear if he is experiencing any frank claudication. He has not had more extensive evaluation for DVT, proteinuria, or reassessment of thyroid status. He does have a significant problem with his urine stream, states that he has very little urine output when he urinates, but goes frequently. He is followed by urologist and is on Flomax. He has been on steroids long-term for temporal arteritis.   Allergies  Allergen Reactions  . Aspirin     REACTION: hives  . Warfarin Sodium     REACTION: gi problems  .  Hydrocodone Other (See Comments)    hallucinations    Current Outpatient Prescriptions  Medication Sig Dispense Refill  . ALPRAZolam (XANAX) 0.5 MG tablet Take 0.5 mg by mouth as needed.       . finasteride (PROSCAR) 5 MG tablet Take 5 mg by mouth at bedtime.      . furosemide (LASIX) 40 MG tablet Take 40 mg by mouth 2 (two) times daily.       Marland Kitchen ipratropium-albuterol (DUONEB) 0.5-2.5 (3) MG/3ML SOLN Take 3 mLs by nebulization every 8 (eight) hours as needed.      . multivitamin (THERAGRAN) per tablet Take 1 tablet by mouth daily.        . nitroGLYCERIN (NITROSTAT) 0.4 MG SL tablet Place 1 tablet (0.4 mg total) under the tongue every 5 (five) minutes as needed. May repeat for up to 3 doses.  25 tablet  3  . omeprazole (PRILOSEC) 20 MG capsule Take 20 mg by mouth daily.      . polyethylene glycol (MIRALAX / GLYCOLAX) packet Take 17 g by mouth as needed.      . potassium chloride SA (K-DUR,KLOR-CON) 20 MEQ tablet Take 1 tablet (20 mEq total) by mouth daily.  30 tablet  6  . prednisoLONE 5 MG TABS Take 5 mg by mouth at bedtime.      . predniSONE (DELTASONE) 10 MG tablet Take 10 mg by mouth every morning.       . Red Yeast Rice Extract (RED YEAST RICE PO) Take 2 capsules by mouth every morning.      . Tamsulosin HCl (FLOMAX) 0.4 MG CAPS  Take 0.8 mg by mouth at bedtime.       . verapamil (COVERA HS) 240 MG (CO) 24 hr tablet Take 240 mg by mouth daily.         No current facility-administered medications for this visit.    Past Medical History  Diagnosis Date  . GERD (gastroesophageal reflux disease)   . Arthritis   . Atrial fibrillation   . GI bleed   . Temporal arteritis   . Carotid artery disease   . Atrial flutter   . Coronary atherosclerosis of native coronary artery     Based on abnormal Cardiolite  . Renal insufficiency   . Peripheral arterial disease     ABIs: 0.74 right; 1.1 left, 10/2012    Social History Glenn Thornton reports that he quit smoking about 18 years ago. His  smoking use included Cigarettes. He has a 50 pack-year smoking history. He has never used smokeless tobacco. Glenn Thornton reports that he does not drink alcohol.  Review of Systems No fevers or chills. No chest pain. No palpitations. Otherwise as outlined.  Physical Examination Filed Vitals:   11/22/12 1534  BP: 190/74  Pulse: 79   Filed Weights   11/22/12 1526  Weight: 184 lb (83.462 kg)   Chronically ill-appearing male seated in a wheelchair. HEENT: Conjunctiva and lids normal, oropharynx clear. Neck: Supple, no elevated JVP or carotid bruits, no thyromegaly. Lungs: Clear to auscultation, nonlabored breathing at rest. Cardiac: Irregular, no S3, soft systolic murmur, no pericardial rub. Abdomen: Protuberant, nontender, bowel sounds present, no guarding or rebound. Extremities: 2-3+ edema centered around the ankles and dorsum of feet, left worse than right. Venous stasis also apparent. Diminished distal pulses. Skin: Thin, scattered ecchymoses. Musculoskeletal: No kyphosis. Neuropsychiatric: Alert and oriented x3, affect grossly appropriate.   Problem List and Plan   Peripheral edema Clear-cut etiology not entirely certain, could be multifactorial. As noted above, LVEF remains in the normal range, no pericardial effusion identified. Chest x-ray demonstrates no effusions or edema. He has not improved despite increasing Lasix dose, and in fact his edema has increased. Several issues to consider including long-term use of steroids, possibility of obstructive uropathy limiting diuresis, associated renal insufficiency, possibility of proteinuria, also reassessment of thyroid status with prior documentation of "goiter." He also has peripheral arterial disease based on recent ABIs which may be associated to some degree. Has not yet been assessed for DVT. Plan at this time is to continue Lasix at current dose and followup with additional testing. Will arrange urinalysis to assess for  proteinuria, CMET, TSH, CBC. Additional imaging studies to include lower extremity venous Dopplers to exclude DVT. I also asked him to more consistently elevate his legs.  Peripheral arterial disease Also plan to make a referral to Dr. Kirke Corin for formal peripheral vascular evaluation.  ESSENTIAL HYPERTENSION, BENIGN Blood pressure seems to be up and down based on home measurements versus office measurements. I expect that he will need an additional agent, states that he is compliant with verapamil. Somewhat reluctant to start anything new now until more information is available pending further testing. We will obtain renal artery duplex studies as well prior to his visit with Dr. Kirke Corin.  ATRIAL FIBRILLATION Continue strategy of heart rate control, prior documentation of GI bleeding and poor candidacy for anticoagulation. He has not even been able to stay on aspirin consistently.  Coronary atherosclerosis of native coronary artery Diagnosis based on previous noninvasive imaging, managed conservatively. No active angina symptoms. Recent LVEF documented at  55-60% with distal inferior hypokinesis.    Jonelle Sidle, M.D., F.A.C.C.

## 2012-11-22 NOTE — Assessment & Plan Note (Addendum)
Clear-cut etiology not entirely certain, could be multifactorial. As noted above, LVEF remains in the normal range, no pericardial effusion identified. Chest x-ray demonstrates no effusions or edema. He has not improved despite increasing Lasix dose, and in fact his edema has increased. Several issues to consider including long-term use of steroids, possibility of obstructive uropathy limiting diuresis, associated renal insufficiency, possibility of proteinuria, also reassessment of thyroid status with prior documentation of "goiter." He also has peripheral arterial disease based on recent ABIs which may be associated to some degree. Has not yet been assessed for DVT. Plan at this time is to continue Lasix at current dose and followup with additional testing. Will arrange urinalysis to assess for proteinuria, CMET, TSH, CBC. Additional imaging studies to include lower extremity venous Dopplers to exclude DVT. I also asked him to more consistently elevate his legs.

## 2012-11-22 NOTE — Assessment & Plan Note (Signed)
Also plan to make a referral to Dr. Kirke Corin for formal peripheral vascular evaluation.

## 2012-11-22 NOTE — Patient Instructions (Addendum)
Your physician recommends that you schedule a follow-up appointment in: 1 month. Your physician recommends that you continue on your current medications as directed. Please refer to the Current Medication list given to you today. Your physician has requested that you have a renal artery duplex. During this test, an ultrasound is used to evaluate blood flow to the kidneys. Allow one hour for this exam. Do not eat after midnight the day before and avoid carbonated beverages. Take your medications as you usually do. Your physician has requested that you have a lower extremity venous duplex. This test is an ultrasound of the veins in the legs or arms. It looks at venous blood flow that carries blood from the heart to the legs or arms. Allow one hour for a Lower Venous exam. Allow thirty minutes for an Upper Venous exam. There are no restrictions or special instructions. You will be referred to Dr. Kirke Corin at our [facility]. Office. Your physician recommends that you return for lab work in: today for UA,CMET,CBC, and TSH. Please go to [facility] to have this done.

## 2012-11-26 ENCOUNTER — Telehealth: Payer: Self-pay | Admitting: *Deleted

## 2012-11-26 NOTE — Telephone Encounter (Signed)
Message copied by Eustace Moore on Mon Nov 26, 2012  8:27 AM ------      Message from: Jonelle Sidle      Created: Fri Nov 23, 2012 11:00 AM       Reviewed results. Renal function is only mildly reduced with a creatinine of 1.3 and GFR 51. Albumin is borderline low, would doubt any substantial protein losses. Normal liver function tests. Normal potassium. Normal TSH. White blood cell count is normal. He is mildly anemic with hemoglobin 11.2, overall nonspecific. Urinalysis does show evidence of proteinuria, no obvious urinary infection.            No major concerns have surfaced based on this screening testing. An additional possibility to consider would be a local skin infection, particularly with long-term use of steroids. If he begins to notice more erythema or pain in his feet, he should see his primary care physician, as additional imaging studies or even antibiotics may need to be considered. ------

## 2012-11-26 NOTE — Telephone Encounter (Signed)
Patient informed. 

## 2012-11-27 ENCOUNTER — Encounter (INDEPENDENT_AMBULATORY_CARE_PROVIDER_SITE_OTHER): Payer: Medicare Other

## 2012-11-27 DIAGNOSIS — R0602 Shortness of breath: Secondary | ICD-10-CM

## 2012-11-27 DIAGNOSIS — I10 Essential (primary) hypertension: Secondary | ICD-10-CM

## 2012-11-27 DIAGNOSIS — R609 Edema, unspecified: Secondary | ICD-10-CM

## 2012-11-27 DIAGNOSIS — N289 Disorder of kidney and ureter, unspecified: Secondary | ICD-10-CM

## 2012-11-29 DIAGNOSIS — I509 Heart failure, unspecified: Secondary | ICD-10-CM

## 2012-11-30 ENCOUNTER — Encounter: Payer: Self-pay | Admitting: *Deleted

## 2012-11-30 DIAGNOSIS — I4891 Unspecified atrial fibrillation: Secondary | ICD-10-CM

## 2012-11-30 DIAGNOSIS — I5030 Unspecified diastolic (congestive) heart failure: Secondary | ICD-10-CM

## 2012-12-02 DIAGNOSIS — I509 Heart failure, unspecified: Secondary | ICD-10-CM

## 2012-12-11 ENCOUNTER — Telehealth: Payer: Self-pay | Admitting: Cardiology

## 2012-12-11 NOTE — Telephone Encounter (Signed)
Would like to reschedule test.

## 2012-12-19 ENCOUNTER — Other Ambulatory Visit: Payer: Self-pay | Admitting: *Deleted

## 2012-12-19 ENCOUNTER — Encounter (INDEPENDENT_AMBULATORY_CARE_PROVIDER_SITE_OTHER): Payer: Medicare Other

## 2012-12-19 ENCOUNTER — Encounter: Payer: Self-pay | Admitting: Cardiology

## 2012-12-19 DIAGNOSIS — M79604 Pain in right leg: Secondary | ICD-10-CM

## 2012-12-19 DIAGNOSIS — R609 Edema, unspecified: Secondary | ICD-10-CM

## 2012-12-19 DIAGNOSIS — M79605 Pain in left leg: Secondary | ICD-10-CM

## 2012-12-19 DIAGNOSIS — I4891 Unspecified atrial fibrillation: Secondary | ICD-10-CM

## 2012-12-19 DIAGNOSIS — I8010 Phlebitis and thrombophlebitis of unspecified femoral vein: Secondary | ICD-10-CM

## 2012-12-19 DIAGNOSIS — R600 Localized edema: Secondary | ICD-10-CM

## 2012-12-19 MED ORDER — RIVAROXABAN 15 MG PO TABS: 15.0000 mg | ORAL_TABLET | Freq: Two times a day (BID) | ORAL | Status: DC

## 2012-12-19 NOTE — Progress Notes (Signed)
Glenn Thornton presented to the office today for lower extremity venous Dopplers. I was informed that he had evidence of a right thigh DVT, had partial flow, and clinically his leg edema has improved. It is possible that he had a clot in this region of greater severity when his initial swelling flared up over the last few months.  He has a history of atrial arrhythmias, and was previously on Coumadin, although discontinued in the setting of GI bleeding. Was also taken off Plavix.  I reviewed the situation with him. I think it would at least be important to treat him over the next month (if tolerated) with an anticoagulant for treatment of his DVT, although hold off long-term anticoagulation. If recurrent event, he may need to have an IVC filter. We discussed the options, will treat him with low-dose Xarelto 15 mg daily in light of his increased bleeding risk, creatinine clearance is greater than 30, creatinine 2.0, most recent hemoglobin was 12.8. He will need an office visit in 2 weeks, followup CBC and BMET at that time.  Jonelle Sidle, M.D., F.A.C.C.

## 2012-12-21 ENCOUNTER — Telehealth: Payer: Self-pay | Admitting: *Deleted

## 2012-12-21 NOTE — Telephone Encounter (Signed)
Daughter called to inform our office that patient bumped his right elbow last night around 7:00 pm and got a skin tear that he can't get to stop bleeding. Nurse called patient and he said that the amount of the blood that's coming out is enough to cover the guaze. Nurse asked patient if he held the gauze down for compression to try and stop the bleeding and patient states that he did not try pressure at the site of the skin tear. Nurse advised patient to try applying pressure to the skin tear long enough for it to clot off and if this did not stop the bleeding to call our office back. Patient verbalized understanding of plan.

## 2012-12-31 ENCOUNTER — Telehealth: Payer: Self-pay | Admitting: *Deleted

## 2012-12-31 NOTE — Telephone Encounter (Signed)
Daughter Andrey Campanile) called & wanted to inform Dr. [name] that patient was put on blood thinner about 10 days ago.  Urologist (Dr. Ian Bushman) found blood in his urine on Friday & started GI bleed on Sunday.  EMS was called & patient admitted to [facility].

## 2013-01-01 DIAGNOSIS — Z01810 Encounter for preprocedural cardiovascular examination: Secondary | ICD-10-CM

## 2013-01-01 DIAGNOSIS — I4891 Unspecified atrial fibrillation: Secondary | ICD-10-CM

## 2013-01-02 ENCOUNTER — Ambulatory Visit (HOSPITAL_COMMUNITY)
Admission: RE | Admit: 2013-01-02 | Discharge: 2013-01-02 | Disposition: A | Discharge status: Home / Self Care | Payer: Medicare Other | Source: Ambulatory Visit | Attending: Internal Medicine | Admitting: Internal Medicine

## 2013-01-02 ENCOUNTER — Other Ambulatory Visit (HOSPITAL_COMMUNITY): Payer: Self-pay | Admitting: Internal Medicine

## 2013-01-02 ENCOUNTER — Encounter (HOSPITAL_COMMUNITY): Payer: Self-pay

## 2013-01-02 ENCOUNTER — Other Ambulatory Visit (HOSPITAL_COMMUNITY): Payer: Self-pay | Admitting: Diagnostic Radiology

## 2013-01-02 DIAGNOSIS — I82401 Acute embolism and thrombosis of unspecified deep veins of right lower extremity: Secondary | ICD-10-CM

## 2013-01-02 DIAGNOSIS — Z886 Allergy status to analgesic agent status: Secondary | ICD-10-CM | POA: Insufficient documentation

## 2013-01-02 DIAGNOSIS — Z885 Allergy status to narcotic agent status: Secondary | ICD-10-CM | POA: Insufficient documentation

## 2013-01-02 DIAGNOSIS — Z8719 Personal history of other diseases of the digestive system: Secondary | ICD-10-CM

## 2013-01-02 DIAGNOSIS — I4891 Unspecified atrial fibrillation: Secondary | ICD-10-CM | POA: Insufficient documentation

## 2013-01-02 DIAGNOSIS — I6529 Occlusion and stenosis of unspecified carotid artery: Secondary | ICD-10-CM | POA: Insufficient documentation

## 2013-01-02 DIAGNOSIS — M316 Other giant cell arteritis: Secondary | ICD-10-CM | POA: Insufficient documentation

## 2013-01-02 DIAGNOSIS — K922 Gastrointestinal hemorrhage, unspecified: Secondary | ICD-10-CM | POA: Insufficient documentation

## 2013-01-02 DIAGNOSIS — K219 Gastro-esophageal reflux disease without esophagitis: Secondary | ICD-10-CM | POA: Insufficient documentation

## 2013-01-02 DIAGNOSIS — Z888 Allergy status to other drugs, medicaments and biological substances status: Secondary | ICD-10-CM | POA: Insufficient documentation

## 2013-01-02 DIAGNOSIS — Z87891 Personal history of nicotine dependence: Secondary | ICD-10-CM | POA: Insufficient documentation

## 2013-01-02 DIAGNOSIS — Z7901 Long term (current) use of anticoagulants: Secondary | ICD-10-CM | POA: Insufficient documentation

## 2013-01-02 DIAGNOSIS — N289 Disorder of kidney and ureter, unspecified: Secondary | ICD-10-CM | POA: Insufficient documentation

## 2013-01-02 DIAGNOSIS — I2510 Atherosclerotic heart disease of native coronary artery without angina pectoris: Secondary | ICD-10-CM | POA: Insufficient documentation

## 2013-01-02 DIAGNOSIS — M129 Arthropathy, unspecified: Secondary | ICD-10-CM | POA: Insufficient documentation

## 2013-01-02 DIAGNOSIS — I82409 Acute embolism and thrombosis of unspecified deep veins of unspecified lower extremity: Secondary | ICD-10-CM | POA: Insufficient documentation

## 2013-01-02 MED ORDER — FENTANYL CITRATE 0.05 MG/ML IJ SOLN
INTRAMUSCULAR | Status: AC | PRN
  Administered 2013-01-02: 25 ug via INTRAVENOUS

## 2013-01-02 MED ORDER — MIDAZOLAM HCL 2 MG/2ML IJ SOLN
INTRAMUSCULAR | Status: AC
  Filled 2013-01-02: qty 2

## 2013-01-02 MED ORDER — FENTANYL CITRATE 0.05 MG/ML IJ SOLN
INTRAMUSCULAR | Status: AC
  Filled 2013-01-02: qty 2

## 2013-01-02 MED ORDER — MIDAZOLAM HCL 2 MG/2ML IJ SOLN
INTRAMUSCULAR | Status: AC | PRN
  Administered 2013-01-02: 1 mg via INTRAVENOUS

## 2013-01-02 MED ORDER — IOHEXOL 300 MG/ML  SOLN
100.0000 mL | Freq: Once | INTRAMUSCULAR | Status: AC | PRN
  Administered 2013-01-02: 40 mL via INTRAVENOUS

## 2013-01-02 NOTE — Progress Notes (Signed)
Review discharge instructions with son and dtr in law denies questions

## 2013-01-02 NOTE — Procedures (Signed)
Post-Procedure Note  Pre-operative Diagnosis: Rt leg DVT and GI bleeding with anticoagulation       Post-operative Diagnosis: same   Indications: Needs PE prophylaxis  Procedure Details:   Consent: Informed consent was obtained.  Placement of IVC filter [facility]) from right IJ approach.   Findings: Patent IVC.  Filter placed below renal veins  Complications: None     Condition: Stable  Plan: Recover in Short Stay and then discharge to home.

## 2013-01-02 NOTE — H&P (Signed)
Glenn Thornton is an 79 y.o. male.   Chief Complaint: Hx A fib New DVT RLE  Placed on Xarelto Now with GI bleed Scheduled now for retrievable inferior vena cava filter placement Pt was discharged from [facility] today- now an OP Will dc home after procedure HPI: Afib; RLE dvt; GI bleed; GERD; CAD; PAD  Past Medical History  Diagnosis Date  . GERD (gastroesophageal reflux disease)   . Arthritis   . Atrial fibrillation   . GI bleed   . Temporal arteritis   . Carotid artery disease   . Atrial flutter   . Coronary atherosclerosis of native coronary artery     Based on abnormal Cardiolite  . Renal insufficiency   . Peripheral arterial disease     ABIs: 0.74 right; 1.1 left, 10/2012    Past Surgical History  Procedure Laterality Date  . Mumford procedure, acromioplasty  10/31/2006  . Left shoulder surgery  2002  . Left knee surgery  1994  . Hernia repair  1999  . Cataract surgery    . Shoulder arthroscopy      Prior  . Rotator cuff repair      History reviewed. No pertinent family history. Social History:  reports that he quit smoking about 18 years ago. His smoking use included Cigarettes. He has a 50 pack-year smoking history. He has never used smokeless tobacco. He reports that he does not drink alcohol or use illicit drugs.  Allergies:  Allergies  Allergen Reactions  . Aspirin     REACTION: hives  . Warfarin Sodium     REACTION: gi problems  . Hydrocodone Other (See Comments)    hallucinations     (Not in a hospital admission)  No results found for this or any previous visit (from the past 48 hour(s)). No results found.  Review of Systems  Constitutional: Negative for fever.  Respiratory: Positive for shortness of breath.   Cardiovascular: Negative for chest pain.  Gastrointestinal: Negative for nausea, vomiting and abdominal pain.  Neurological: Positive for weakness. Negative for headaches.    There were no vitals taken for this visit. Physical  Exam  Constitutional: He is oriented to person, place, and time. He appears well-developed.  Cardiovascular: Normal rate, regular rhythm and normal heart sounds.   No murmur heard. Respiratory: Effort normal and breath sounds normal.  GI: Soft. Bowel sounds are normal. There is no tenderness.  Musculoskeletal: Normal range of motion.  Weak; uses wc  Neurological: He is alert and oriented to person, place, and time.  Skin: Skin is warm and dry.  Psychiatric: He has a normal mood and affect. His behavior is normal. Judgment and thought content normal.     Assessment/Plan Hx afib Recent RLE dvt--on Xarelto Now with new GI bleed; off Xarelto Scheduled now for retrievable IVC filter Pt and family aware of procedure benefits and risks and agreeable to proceed Consent signed and in chart  Morgen Linebaugh A 01/02/2013, 11:58 AM

## 2013-01-04 ENCOUNTER — Ambulatory Visit: Payer: Medicare Other | Admitting: Cardiology

## 2013-01-07 ENCOUNTER — Telehealth: Payer: Self-pay | Admitting: Cardiology

## 2013-01-07 NOTE — Telephone Encounter (Signed)
Daughter called to let us know that his feet are swelling.  Thinks it is from where they did not give him lasix in the [place].

## 2013-01-08 NOTE — Telephone Encounter (Signed)
Spoke with patient's daughter and informed her that patient was d/c on furosemide 40 mg twice daily. Daughter stated that patient's "legs were swollen again like before he was dx with DVT." Daughter upset because patient's lasix was held during his last hospital admission by Dr. Sherryll Burger and she feels this is why his legs are swollen now. Daughter said "she doesn't ever want patient to see Dr. Sherryll Burger again". Daughter stated that patient has an appointment this morning with Dr. Leary Roca @10 :00 am. Nurse advised daughter that PCP would be able to make any necessary adjustments to furosemide for LLE. Nurse also advised daughter that patient is scheduled with [name] next week.

## 2013-01-17 ENCOUNTER — Encounter: Payer: Self-pay | Admitting: Cardiology

## 2013-01-17 ENCOUNTER — Ambulatory Visit (INDEPENDENT_AMBULATORY_CARE_PROVIDER_SITE_OTHER): Payer: Medicare Other | Admitting: Cardiology

## 2013-01-17 VITALS — BP 107/54 | HR 56 | Ht 71.0 in | Wt 180.4 lb

## 2013-01-17 DIAGNOSIS — I4891 Unspecified atrial fibrillation: Secondary | ICD-10-CM

## 2013-01-17 DIAGNOSIS — I10 Essential (primary) hypertension: Secondary | ICD-10-CM

## 2013-01-17 DIAGNOSIS — I82401 Acute embolism and thrombosis of unspecified deep veins of right lower extremity: Secondary | ICD-10-CM | POA: Insufficient documentation

## 2013-01-17 DIAGNOSIS — R609 Edema, unspecified: Secondary | ICD-10-CM

## 2013-01-17 DIAGNOSIS — I82409 Acute embolism and thrombosis of unspecified deep veins of unspecified lower extremity: Secondary | ICD-10-CM

## 2013-01-17 NOTE — Assessment & Plan Note (Signed)
Continue strategy of heart rate control, prior documentation of GI bleeding and poor candidacy for anticoagulation. He has not even been able to stay on aspirin consistently. 

## 2013-01-17 NOTE — Assessment & Plan Note (Signed)
This will be difficult to manage, unlikely to completely resolve. He will continue on Lasix 80 mg in the morning, elevate legs when able. Could use additional 40 mg Lasix when necessary.

## 2013-01-17 NOTE — Patient Instructions (Addendum)
Your physician recommends that you schedule a follow-up appointment in: 1 month. Your physician recommends that you continue on your current medications as directed. Please refer to the Current Medication list given to you today.  You may take an extra 40 mg furosemide midday as needed for leg edema.

## 2013-01-17 NOTE — Assessment & Plan Note (Signed)
Blood pressure is normal today. 

## 2013-01-17 NOTE — Progress Notes (Signed)
Clinical Summary Mr. Rhines is a medically complex 79 y.o.male last seen in the office in May, more recently in consultation at [facility] in July. He had been diagnosed with a right lower extremity DVT and we attempted a short course of lowered dose Xarelto for treatment, realizing that his risk of bleeding was increased based on prior history. He was not able to tolerate Xarelto with recurrent GI bleed in July, ultimately had an IVC filter placed at [facility], and is no longer anticoagulated.  He is here with his son today, actually looks much better. Still has leg edema, back on Lasix at 80 mg in the mornings. He is not reporting any leg pain on the right side.  Most recent lab work done through primary care on 7/15 showed BUN 29, creatinine 1.4, potassium 3.7. Last hemoglobin was 8.5.   Allergies  Allergen Reactions  . Aspirin     REACTION: hives  . Warfarin Sodium     REACTION: gi problems  . Hydrocodone Other (See Comments)    hallucinations    Current Outpatient Prescriptions  Medication Sig Dispense Refill  . ALPRAZolam (XANAX) 0.5 MG tablet Take 0.5 mg by mouth as needed.       . finasteride (PROSCAR) 5 MG tablet Take 5 mg by mouth at bedtime.      . furosemide (LASIX) 40 MG tablet Take 80 mg by mouth daily. May take an extra 40 mg in the middle of day as needed for leg edema      . ipratropium-albuterol (DUONEB) 0.5-2.5 (3) MG/3ML SOLN Take 3 mLs by nebulization every 8 (eight) hours as needed.      . multivitamin (THERAGRAN) per tablet Take 1 tablet by mouth daily.        . nitroGLYCERIN (NITROSTAT) 0.4 MG SL tablet Place 1 tablet (0.4 mg total) under the tongue every 5 (five) minutes as needed. May repeat for up to 3 doses.  25 tablet  3  . omeprazole (PRILOSEC) 20 MG capsule Take 20 mg by mouth daily.      . polyethylene glycol (MIRALAX / GLYCOLAX) packet Take 17 g by mouth as needed.      . potassium chloride SA (K-DUR,KLOR-CON) 20 MEQ tablet Take 1 tablet (20 mEq total)  by mouth daily.  30 tablet  6  . prednisoLONE 5 MG TABS Take 5 mg by mouth at bedtime.      . predniSONE (DELTASONE) 10 MG tablet Take 10 mg by mouth every morning.       . Red Yeast Rice Extract (RED YEAST RICE PO) Take 2 capsules by mouth every morning.      . Tamsulosin HCl (FLOMAX) 0.4 MG CAPS Take 0.8 mg by mouth at bedtime.       . verapamil (COVERA HS) 240 MG (CO) 24 hr tablet Take 240 mg by mouth daily.         No current facility-administered medications for this visit.    Past Medical History  Diagnosis Date  . GERD (gastroesophageal reflux disease)   . Arthritis   . Atrial fibrillation   . GI bleed   . Temporal arteritis   . Carotid artery disease   . Atrial flutter   . Coronary atherosclerosis of native coronary artery     Based on abnormal Cardiolite  . Renal insufficiency   . Peripheral arterial disease     ABIs: 0.74 right; 1.1 left, 10/2012    Social History Mr. Shiffer reports that he quit  smoking about 18 years ago. His smoking use included Cigarettes. He has a 50 pack-year smoking history. He has never used smokeless tobacco. Mr. Lottman reports that he does not drink alcohol.  Review of Systems No palpitations, no chest pain. Appetite has been stable. No fevers or chills.  Physical Examination Filed Vitals:   01/17/13 1526  BP: 107/54  Pulse: 56   Filed Weights   01/17/13 1526  Weight: 180 lb 6.4 oz (81.829 kg)    Chronically ill-appearing male seated in a wheelchair.  HEENT: Conjunctiva and lids normal, oropharynx clear.  Neck: Supple, no elevated JVP or carotid bruits, no thyromegaly.  Lungs: Clear to auscultation, nonlabored breathing at rest.  Cardiac: Irregular, no S3, soft systolic murmur, no pericardial rub.  Abdomen: Protuberant, nontender, bowel sounds present, no guarding or rebound.  Extremities: 2+ edema. Venous stasis also    Problem List and Plan   Right leg DVT Mr. Donaway is not able to be anticoagulated with a history of  recurrent GI bleeding. He now has an IVC filter in place.  Peripheral edema This will be difficult to manage, unlikely to completely resolve. He will continue on Lasix 80 mg in the morning, elevate legs when able. Could use additional 40 mg Lasix when necessary.  Atrial fibrillation Continue strategy of heart rate control, prior documentation of GI bleeding and poor candidacy for anticoagulation. He has not even been able to stay on aspirin consistently.  Essential hypertension, benign Blood pressure is normal today.    Jonelle Sidle, M.D., F.A.C.C.

## 2013-01-17 NOTE — Assessment & Plan Note (Signed)
Glenn Thornton is not able to be anticoagulated with a history of recurrent GI bleeding. He now has an IVC filter in place. 

## 2013-02-19 ENCOUNTER — Encounter: Payer: Self-pay | Admitting: Cardiology

## 2013-02-19 ENCOUNTER — Ambulatory Visit (INDEPENDENT_AMBULATORY_CARE_PROVIDER_SITE_OTHER): Payer: Medicare Other | Admitting: Cardiology

## 2013-02-19 VITALS — BP 138/60 | HR 50 | Ht 71.0 in | Wt 183.0 lb

## 2013-02-19 DIAGNOSIS — I2510 Atherosclerotic heart disease of native coronary artery without angina pectoris: Secondary | ICD-10-CM

## 2013-02-19 DIAGNOSIS — R609 Edema, unspecified: Secondary | ICD-10-CM

## 2013-02-19 DIAGNOSIS — I82401 Acute embolism and thrombosis of unspecified deep veins of right lower extremity: Secondary | ICD-10-CM

## 2013-02-19 DIAGNOSIS — I82409 Acute embolism and thrombosis of unspecified deep veins of unspecified lower extremity: Secondary | ICD-10-CM

## 2013-02-19 DIAGNOSIS — I4891 Unspecified atrial fibrillation: Secondary | ICD-10-CM

## 2013-02-19 MED ORDER — FUROSEMIDE 40 MG PO TABS: ORAL_TABLET | ORAL | Status: AC

## 2013-02-19 NOTE — Progress Notes (Signed)
Clinical Summary Glenn Thornton is a medically complex 79 y.o.male last seen in July. He is here with his daughter-in-law today. Reports no major changes. Still has leg edema mainly around the lower legs and ankles. Still looks much better than previously.  Recent lab work in October showed BUN 41, creatinine 1.6, potassium 4.5. His weight is up 3 pounds from July. He tells me that he has actually been taking Lasix 40 mg most mornings, sometimes using 80 mg in the mornings, but no more often than every other day.   Allergies  Allergen Reactions  . Aspirin     REACTION: hives  . Warfarin Sodium     REACTION: gi problems  . Hydrocodone Other (See Comments)    hallucinations    Current Outpatient Prescriptions  Medication Sig Dispense Refill  . ALPRAZolam (XANAX) 0.5 MG tablet Take 0.5 mg by mouth as needed.       Marland Kitchen escitalopram (LEXAPRO) 10 MG tablet Take 10 mg by mouth daily.      . finasteride (PROSCAR) 5 MG tablet Take 5 mg by mouth at bedtime.      . furosemide (LASIX) 40 MG tablet Take 80 mg 5 days a week and 40 mg the other days as directed.  60 tablet  6  . ipratropium-albuterol (DUONEB) 0.5-2.5 (3) MG/3ML SOLN Take 3 mLs by nebulization every 8 (eight) hours as needed.      . multivitamin (THERAGRAN) per tablet Take 1 tablet by mouth daily.        . nitroGLYCERIN (NITROSTAT) 0.4 MG SL tablet Place 1 tablet (0.4 mg total) under the tongue every 5 (five) minutes as needed. May repeat for up to 3 doses.  25 tablet  3  . omeprazole (PRILOSEC) 20 MG capsule Take 20 mg by mouth daily.      . polyethylene glycol (MIRALAX / GLYCOLAX) packet Take 17 g by mouth as needed.      . potassium chloride SA (K-DUR,KLOR-CON) 20 MEQ tablet Take 1 tablet (20 mEq total) by mouth daily.  30 tablet  6  . prednisoLONE 5 MG TABS Take 5 mg by mouth at bedtime.      . predniSONE (DELTASONE) 10 MG tablet Take 10 mg by mouth every morning.       . Red Yeast Rice Extract (RED YEAST RICE PO) Take 2 capsules by  mouth every morning.      . Tamsulosin HCl (FLOMAX) 0.4 MG CAPS Take 0.8 mg by mouth at bedtime.       . verapamil (COVERA HS) 240 MG (CO) 24 hr tablet Take 240 mg by mouth daily.         No current facility-administered medications for this visit.    Past Medical History  Diagnosis Date  . GERD (gastroesophageal reflux disease)   . Arthritis   . Atrial fibrillation   . GI bleed   . Temporal arteritis   . Carotid artery disease   . Atrial flutter   . Coronary atherosclerosis of native coronary artery     Based on abnormal Cardiolite  . Renal insufficiency   . Peripheral arterial disease     ABIs: 0.74 right; 1.1 left, 10/2012    Social History Glenn Thornton reports that he quit smoking about 18 years ago. His smoking use included Cigarettes. He has a 50 pack-year smoking history. He has never used smokeless tobacco. Glenn Thornton reports that he does not drink alcohol.  Review of Systems No angina. Stable shortness of  breath. No hospitalizations.  Physical Examination Filed Vitals:   02/19/13 1505  BP: 138/60  Pulse: 50   Filed Weights   02/19/13 1505  Weight: 183 lb (83.008 kg)    Chronically ill-appearing male seated in a wheelchair.  HEENT: Conjunctiva and lids normal, oropharynx clear.  Neck: Supple, no elevated JVP or carotid bruits, no thyromegaly.  Lungs: Clear to auscultation, nonlabored breathing at rest.  Cardiac: Irregular, no S3, soft systolic murmur, no pericardial rub.  Abdomen: Protuberant, nontender, bowel sounds present, no guarding or rebound.  Extremities: 1-2+ edema. Venous stasis also    Problem List and Plan   Peripheral edema Will try his Lasix 80 mg in the mornings Monday through Friday, reduced to 40 mg in the mornings on the weekends. Followup arranged.  Right leg DVT Glenn Thornton is not able to be anticoagulated with a history of recurrent GI bleeding. He now has an IVC filter in place.  Renal insufficiency Relatively stable, recent  creatinine 1.6.  Coronary atherosclerosis of native coronary artery Continue medical therapy and observation.  Atrial fibrillation Continue strategy of heart rate control, prior documentation of GI bleeding and poor candidacy for anticoagulation. He has not even been able to stay on aspirin consistently.    Jonelle Sidle, M.D., F.A.C.C.

## 2013-02-19 NOTE — Assessment & Plan Note (Signed)
Relatively stable, recent creatinine 1.6.

## 2013-02-19 NOTE — Assessment & Plan Note (Signed)
Glenn Thornton is not able to be anticoagulated with a history of recurrent GI bleeding. He now has an IVC filter in place.

## 2013-02-19 NOTE — Assessment & Plan Note (Signed)
Will try his Lasix 80 mg in the mornings Monday through Friday, reduced to 40 mg in the mornings on the weekends. Followup arranged.

## 2013-02-19 NOTE — Assessment & Plan Note (Signed)
Continue strategy of heart rate control, prior documentation of GI bleeding and poor candidacy for anticoagulation. He has not even been able to stay on aspirin consistently.

## 2013-02-19 NOTE — Patient Instructions (Signed)
Your physician recommends that you schedule a follow-up appointment in: 2 months with Dr. [name]. This appointment will be scheduled today.  Take Furosemide (Lasix) 80 mg at least 5 days a week and 40 mg the other days as directed  Continue all other medications the same.

## 2013-02-19 NOTE — Assessment & Plan Note (Signed)
Continue medical therapy and observation. 

## 2013-04-18 ENCOUNTER — Telehealth: Payer: Self-pay | Admitting: *Deleted

## 2013-04-18 NOTE — Telephone Encounter (Signed)
Daughter informed

## 2013-04-18 NOTE — Telephone Encounter (Signed)
Received call from daughter saying she was concerned about abnormal BNP level that was done by patient's PCP today. Daughter said she faxed over the results to our office. Nurse called daughter and informed her that the fax machine was down at this time but as soon as it comes back up, results would be sent to MD for review. Spoke with patient and he said that Dr. Leary Roca did routine lab testing on him today. Patient informed nurse that he wasn't having any sob, dizziness or sob. Patient informed also that results would be sent to MD for review.

## 2013-04-18 NOTE — Telephone Encounter (Signed)
Noted. Recent BNP 509. This is not all that different from prior levels, in fact down from 540 the last time it was checked. BNP is only a piece of the whole clinical picture. If his weight has been stable, no progressive edema, and his breathing status is also stable, would continue current regimen.

## 2013-05-01 ENCOUNTER — Encounter: Payer: Self-pay | Admitting: Cardiology

## 2013-05-01 ENCOUNTER — Ambulatory Visit (INDEPENDENT_AMBULATORY_CARE_PROVIDER_SITE_OTHER): Payer: Medicare Other | Admitting: Cardiology

## 2013-05-01 VITALS — BP 119/72 | HR 90 | Ht 71.0 in | Wt 187.0 lb

## 2013-05-01 DIAGNOSIS — I10 Essential (primary) hypertension: Secondary | ICD-10-CM

## 2013-05-01 DIAGNOSIS — R609 Edema, unspecified: Secondary | ICD-10-CM

## 2013-05-01 DIAGNOSIS — N289 Disorder of kidney and ureter, unspecified: Secondary | ICD-10-CM

## 2013-05-01 DIAGNOSIS — I82409 Acute embolism and thrombosis of unspecified deep veins of unspecified lower extremity: Secondary | ICD-10-CM

## 2013-05-01 DIAGNOSIS — I82401 Acute embolism and thrombosis of unspecified deep veins of right lower extremity: Secondary | ICD-10-CM

## 2013-05-01 DIAGNOSIS — I2510 Atherosclerotic heart disease of native coronary artery without angina pectoris: Secondary | ICD-10-CM

## 2013-05-01 NOTE — Assessment & Plan Note (Signed)
No active angina symptoms. Continue conservative management.

## 2013-05-01 NOTE — Assessment & Plan Note (Signed)
Blood pressure control is good today. 

## 2013-05-01 NOTE — Progress Notes (Signed)
Clinical Summary Glenn Thornton is a medically complex 79 y.o.male last seen in August. He is here with his feet today. States that he has been feeling fairly good. Has not been hospitalized. Reports stable mild leg edema. We reviewed his medications and he reports a good understanding of his Lasix dosing.  Lab work in October showed hemoglobin 11.3, BUN 44, creatinine 1.7, sodium 141, potassium 4.5. BNP was 509 at that time. Weight is up 4 pounds from the last visit. He states that he has been eating "very good." Suspect some of his weight gain is caloric.  Allergies  Allergen Reactions  . Aspirin     REACTION: hives  . Warfarin Sodium     REACTION: gi problems  . Hydrocodone Other (See Comments)    hallucinations    Current Outpatient Prescriptions  Medication Sig Dispense Refill  . ALPRAZolam (XANAX) 0.5 MG tablet Take 0.5 mg by mouth as needed.       Marland Kitchen escitalopram (LEXAPRO) 10 MG tablet Take 10 mg by mouth daily.      . finasteride (PROSCAR) 5 MG tablet Take 5 mg by mouth at bedtime.      . furosemide (LASIX) 40 MG tablet Take 80 mg 5 days a week and 40 mg the other days as directed.  60 tablet  6  . ipratropium-albuterol (DUONEB) 0.5-2.5 (3) MG/3ML SOLN Take 3 mLs by nebulization every 8 (eight) hours as needed.      . multivitamin (THERAGRAN) per tablet Take 1 tablet by mouth daily.        . nitroGLYCERIN (NITROSTAT) 0.4 MG SL tablet Place 1 tablet (0.4 mg total) under the tongue every 5 (five) minutes as needed. May repeat for up to 3 doses.  25 tablet  3  . omeprazole (PRILOSEC) 20 MG capsule Take 20 mg by mouth daily.      . polyethylene glycol (MIRALAX / GLYCOLAX) packet Take 17 g by mouth as needed.      . potassium chloride SA (K-DUR,KLOR-CON) 20 MEQ tablet Take 1 tablet (20 mEq total) by mouth daily.  30 tablet  6  . prednisoLONE 5 MG TABS Take 5 mg by mouth at bedtime.      . predniSONE (DELTASONE) 10 MG tablet Take 10 mg by mouth every morning.       . ranitidine  (ZANTAC) 150 MG tablet Take 150 mg by mouth daily.       . Red Yeast Rice Extract (RED YEAST RICE PO) Take 2 capsules by mouth every morning.      . Tamsulosin HCl (FLOMAX) 0.4 MG CAPS Take 0.8 mg by mouth at bedtime.       . verapamil (COVERA HS) 240 MG (CO) 24 hr tablet Take 240 mg by mouth daily.         No current facility-administered medications for this visit.    Past Medical History  Diagnosis Date  . GERD (gastroesophageal reflux disease)   . Arthritis   . Atrial fibrillation   . GI bleed   . Temporal arteritis   . Carotid artery disease   . Atrial flutter   . Coronary atherosclerosis of native coronary artery     Based on abnormal Cardiolite  . Renal insufficiency   . Peripheral arterial disease     ABIs: 0.74 right; 1.1 left, 10/2012    Social History Glenn Thornton reports that he quit smoking about 18 years ago. His smoking use included Cigarettes. He has a 50 pack-year  smoking history. He has never used smokeless tobacco. Glenn Thornton reports that he does not drink alcohol.  Review of Systems No palpitations, no chest pain. Otherwise negative except as outlined.  Physical Examination Filed Vitals:   05/01/13 1454  BP: 119/72  Pulse: 90   Filed Weights   05/01/13 1454  Weight: 187 lb (84.823 kg)    Chronically ill-appearing male seated in a wheelchair.  HEENT: Conjunctiva and lids normal, oropharynx clear.  Neck: Supple, no elevated JVP or carotid bruits, no thyromegaly.  Lungs: Clear to auscultation, nonlabored breathing at rest.  Cardiac: Irregular, no S3, soft systolic murmur, no pericardial rub.  Abdomen: Protuberant, nontender, bowel sounds present, no guarding or rebound.  Extremities: 1+ edema. Venous stasis.   Problem List and Plan   Peripheral edema Quite stable on examination. No change to current Lasix regimen. Followup BMET for next visit.  Right leg DVT Glenn Thornton is not able to be anticoagulated with a history of recurrent GI bleeding. He  now has an IVC filter in place.  Essential hypertension, benign Blood pressure control is good today.  Coronary atherosclerosis of native coronary artery No active angina symptoms. Continue conservative management.    Jonelle Sidle, M.D., F.A.C.C.

## 2013-05-01 NOTE — Patient Instructions (Signed)
Your physician recommends that you schedule a follow-up appointment in: 3 months. Your physician recommends that you continue on your current medications as directed. Please refer to the Current Medication list given to you today. Your physician recommends that you have lab work in 3 months just before your next visit in February 2015 to check your BMET.

## 2013-05-01 NOTE — Assessment & Plan Note (Signed)
Glenn Thornton is not able to be anticoagulated with a history of recurrent GI bleeding. He now has an IVC filter in place.

## 2013-05-01 NOTE — Assessment & Plan Note (Signed)
Quite stable on examination. No change to current Lasix regimen. Followup BMET for next visit.

## 2013-06-06 ENCOUNTER — Telehealth: Payer: Self-pay | Admitting: Cardiology

## 2013-06-06 NOTE — Telephone Encounter (Signed)
Daughter called to inform our office that patients PCP has taken him off of oxygen because his O2 sat was very good on room air. Nurse informed patient that certain criteria would have to be met in order for insurance to cover oxygen. Daughter requested names of PCP's in this area. Several names were given to daughter.

## 2013-06-06 NOTE — Telephone Encounter (Signed)
CELL# [phone]  Daughter would like to speak with nurse in reference to her dad and oxygen issues

## 2013-07-22 ENCOUNTER — Telehealth: Payer: Self-pay

## 2013-07-22 NOTE — Telephone Encounter (Signed)
Patient past away per [facility]bituary  °

## 2013-07-28 DEATH — deceased

## 2013-08-15 ENCOUNTER — Ambulatory Visit: Payer: Medicare Other | Admitting: Cardiology

## 2013-12-26 IMAGING — US IR IVC FILTER PLMT / S&I /IMG GUID/MOD SED
1 series · 1 of 1 positions shown · non-contrast
Comparison: none

INDICATION: 87-year-old with right lower extremity deep vein
thrombosis. Not a candidate for anticoagulation due to GI bleeding
at this time.  The patient needs pulmonary embolism prophylaxis.

[Series 1: ir ivc filter plmt / s&i /img guid/mod sed · 1 of 1 slices shown]
[im 1/1]
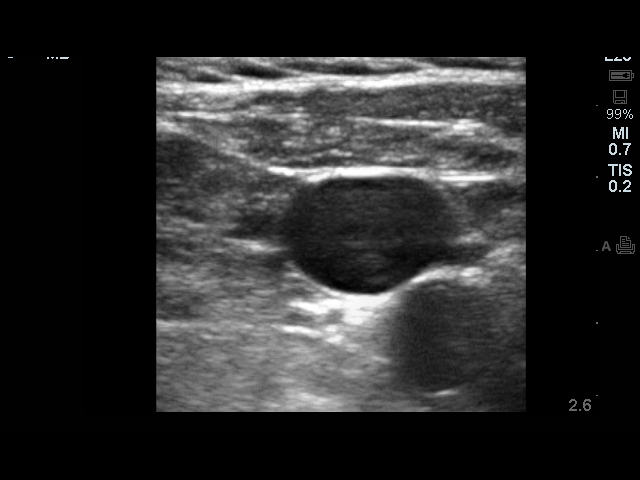

[1 of 1 positions shown; findings below may reference images not displayed]

PROCEDURE(S): IVC FILTER PLACEMENT; IVC VENOGRAM; ULTRASOUND FOR
VASCULAR ACCESS

Medications: Versed 1 mg, Fentanyl 25 mcg. A radiology nurse
monitored the patient for moderate sedation.

Moderate sedation time: 20 minutes

Fluoroscopy time:  2 minutes and 18 seconds

Contrast:40 ml Imnipaque-YTT

Procedure:Informed consent was obtained for an IVC venogram and
filter placement.  Ultrasound demonstrated a patent right internal
jugular vein.  Ultrasound images were obtained for documentation.
The right side of the neck was prepped and draped in a sterile
fashion.  Maximal barrier sterile technique was utilized including
caps, mask, sterile gowns, sterile gloves, sterile drape, hand
hygiene and skin antiseptic.  The skin was anesthetized with 1%
lidocaine.  A 21 gauge needle was directed into the vein with
ultrasound guidance and a micropuncture dilator set was placed.  A
wire was advanced into the IVC.  The filter sheath was advanced
over the wire into the IVC.  An IVC venogram was performed.
Fluoroscopic images were obtained for documentation.  Chiquis Talib
filter was deployed below the lowest renal vein. A follow-up
venogram was performed and the vascular sheath was removed with
manual compression.
FINDINGS: IVC was patent.  Bilateral renal veins were identified.
The filter was deployed below the lowest renal vein.  Follow-up
venogram confirmed placement within the IVC and below the renal
veins.
IMPRESSION: Successful placement of a retrievable IVC filter.

## 2018-12-26 DEATH — deceased
